# Patient Record
Sex: Female | Born: 1985 | Race: Black or African American | Hispanic: No | Marital: Married | Smoking: Never smoker
Health system: Southern US, Community
[De-identification: ages and names within clinical notes are randomized; demographics above are authoritative.]

## PROBLEM LIST (undated history)

## (undated) DIAGNOSIS — IMO0002 Reserved for concepts with insufficient information to code with codable children: Secondary | ICD-10-CM

## (undated) DIAGNOSIS — M359 Systemic involvement of connective tissue, unspecified: Secondary | ICD-10-CM

## (undated) DIAGNOSIS — R87619 Unspecified abnormal cytological findings in specimens from cervix uteri: Secondary | ICD-10-CM

## (undated) DIAGNOSIS — G43909 Migraine, unspecified, not intractable, without status migrainosus: Secondary | ICD-10-CM

## (undated) DIAGNOSIS — D649 Anemia, unspecified: Secondary | ICD-10-CM

## (undated) DIAGNOSIS — Z862 Personal history of diseases of the blood and blood-forming organs and certain disorders involving the immune mechanism: Secondary | ICD-10-CM

## (undated) HISTORY — PX: SPLENECTOMY, PARTIAL: SHX787

## (undated) HISTORY — DX: Personal history of diseases of the blood and blood-forming organs and certain disorders involving the immune mechanism: Z86.2

## (undated) HISTORY — DX: Anemia, unspecified: D64.9

## (undated) HISTORY — PX: CHOLECYSTECTOMY: SHX55

## (undated) HISTORY — DX: Reserved for concepts with insufficient information to code with codable children: IMO0002

## (undated) HISTORY — DX: Unspecified abnormal cytological findings in specimens from cervix uteri: R87.619

---
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
## 2010-10-15 ENCOUNTER — Other Ambulatory Visit (HOSPITAL_COMMUNITY)
Admission: RE | Admit: 2010-10-15 | Discharge: 2010-10-15 | Disposition: A | Discharge status: Home / Self Care | Payer: Self-pay | Source: Ambulatory Visit | Attending: Family Medicine | Admitting: Family Medicine

## 2010-10-15 ENCOUNTER — Encounter: Payer: Self-pay | Admitting: *Deleted

## 2010-10-15 ENCOUNTER — Ambulatory Visit (INDEPENDENT_AMBULATORY_CARE_PROVIDER_SITE_OTHER): Payer: Self-pay | Admitting: Family Medicine

## 2010-10-15 VITALS — BP 106/68 | HR 71 | Temp 98.6°F | Ht 64.5 in | Wt 161.1 lb

## 2010-10-15 DIAGNOSIS — IMO0002 Reserved for concepts with insufficient information to code with codable children: Secondary | ICD-10-CM

## 2010-10-15 DIAGNOSIS — Z862 Personal history of diseases of the blood and blood-forming organs and certain disorders involving the immune mechanism: Secondary | ICD-10-CM | POA: Insufficient documentation

## 2010-10-15 DIAGNOSIS — N870 Mild cervical dysplasia: Secondary | ICD-10-CM | POA: Insufficient documentation

## 2010-10-15 DIAGNOSIS — N72 Inflammatory disease of cervix uteri: Secondary | ICD-10-CM | POA: Insufficient documentation

## 2010-10-15 DIAGNOSIS — R87612 Low grade squamous intraepithelial lesion on cytologic smear of cervix (LGSIL): Secondary | ICD-10-CM

## 2010-10-15 DIAGNOSIS — R6889 Other general symptoms and signs: Secondary | ICD-10-CM

## 2010-10-15 NOTE — Progress Notes (Signed)
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  Has history of previous abnormal PAP, but doesn't know the results.  She has never had a colposcopy.  Patient given informed consent, signed copy in the chart, time out was performed.  Placed in lithotomy position. Cervix viewed with speculum and colposcope after application of acetic acid.   Colposcopy adequate?  yes Acetowhite lesions?11 o'clock, 7 o'clock. Punctation? no Mosaicism?  12 o'clock Abnormal vasculature?  7 o'clock Biopsies?7 o'clock, 11 o'clock, 12 o'clock ECC?yes  Patient was given post procedure instructions.  She will return in 2 weeks for results.

## 2010-10-15 NOTE — Progress Notes (Signed)
Addended by: Levie Heritage on: 10/15/2010 03:24 PM   Modules accepted: Orders

## 2010-11-11 ENCOUNTER — Ambulatory Visit (INDEPENDENT_AMBULATORY_CARE_PROVIDER_SITE_OTHER): Payer: Self-pay | Admitting: Obstetrics & Gynecology

## 2010-11-11 ENCOUNTER — Encounter: Payer: Self-pay | Admitting: Family Medicine

## 2010-11-11 VITALS — BP 107/69 | HR 69 | Temp 98.2°F | Ht 64.0 in | Wt 163.4 lb

## 2010-11-11 DIAGNOSIS — N870 Mild cervical dysplasia: Secondary | ICD-10-CM | POA: Insufficient documentation

## 2010-11-11 NOTE — Patient Instructions (Signed)
Cervical Dysplasia Cervical dysplasia is a condition in which a woman has abnormal changes in the cells of her cervix. The cervix is the opening to the uterus (womb) between the vagina and the uterus. These changes are called cervical dysplasia and may be the first signs of cervical cancer. These cells can be taken from the cervix during a Pap test and then looked at under a microscope. With early detection, treatment, and close follow-up care, nearly all cervical dysplasia can be cured. If untreated, the mild to moderate stages of dysplasia often grow more severe.  RISK FACTORS  The following increase the risk for cervical dysplasia.  Having had a sexually transmitted disease, including:   Chlamydia.   Human papilloma virus (HPV).   Becoming sexually active before age 32.   Having had more than 1 sexual partner.   Not using protection, such as condoms, during sexual intercourse, especially with new sexual partners.   Having had cancer of the vagina or vulva.   Having a sexual partner whose previous partner had cancer of the cervix or cervical dysplasia.   Having a sexual partner who has or has had cancer of the penis.   Having a weakened immune system (HIV, organ transplant).   Being the daughter of a woman who took DES (diethylstilbestrol) during pregnancy.   A history of cervical cancer in a woman's sister or mother.   Smoking.   Having had an abnormal Pap test in the past.  SYMPTOMS  There are usually no symptoms. If there are symptoms, they may be vague such as:  Abnormal vaginal discharge.   Bleeding between periods or following intercourse.   Bleeding during menopause.   Pain on intercourse (dyspareunia).  DIAGNOSIS   The Pap test is the best way of detecting abnormalities of the cervix.   Biopsy (removing a piece of tissue to look at under the microscope) of the cervix when the Pap test is abnormal or when the Pap test is normal, but the cervix looks abnormal.    TREATMENT  Your caregiver will advise you regarding the need and timing of Pap tests in your follow-up. Women who have been treated for dysplasia should be closely followed with pelvic exams and Pap tests. During the first year following treatment of cervical dysplasia, Pap tests should be done every 6 months, or as recommended by your caregiver. See your caregiver for new or worsening problems. SEEK MEDICAL CARE IF:   You develop genital warts.   You need a prescription for pain medicine following your treatment.  SEEK IMMEDIATE MEDICAL CARE IF:   Your bleeding is heavier than a normal menstrual period.   You develop bright red bleeding, especially if you have blood clots.   You have a fever.   You have increasing cramps or pain not relieved with medicine.   You are lightheaded, unusually weak, or have fainting spells.   You have abnormal vaginal discharge.   You develop abdominal pain.  PREVENTION   The surest way to prevent cervical dysplasia is to abstain from sexual intercourse.   Practice safe sex, use condoms and have only one sex partner who does not have other sex partners.   A Pap test is done to screen for cervical cancer.   The first Pap test should be done at age 20.   Between ages 33 and 76, Pap tests are repeated every 2 years.   Beginning at age 12, you are advised to have a Pap test every 3 years  as long as your past 3 Pap tests have been normal.   Some women have medical problems that increase the chance of getting cervical cancer. Talk to your caregiver about these problems. It is especially important to talk to your caregiver if a new problem develops soon after your last Pap test. In these cases, your caregiver may recommend more frequent screening and Pap tests.   The above recommendations are the same for women who have or have not gotten the vaccine for HPV (Human Papillomavirus).   If you had a hysterectomy for a problem that was not a cancer or a  condition that could lead to cancer, then you no longer need Pap tests. However, even if you no longer need a Pap test, a regular exam is a good idea to make sure no other problems are starting.    If you are between ages 59 and 84, and you have had normal Pap tests going back 10 years, you no longer need Pap tests. However, even if you no longer need a Pap test, a regular exam is a good idea to make sure no other problems are starting.    If you have had past treatment for cervical cancer or a condition that could lead to cancer, you need Pap tests and screening for cancer for at least 20 years after your treatment.   If Pap tests have been discontinued, risk factors (such as a new sexual partner) need to be re-assessed to determine if screening should be resumed.   Some women may need screenings more often if they are at high risk for cervical cancer.   Your caregiver may do additional tests including:   Colposcopy. A procedure in which a special microscope magnifies the cells and allows the provider to closely examine the cervix, vagina, and vulva.   Biopsy. A small tissue sample is taken from the cervix, vagina or vulva. This is generally done in your caregivers office.   A cone biopsy (cold knife or laser). A large tissue sample is taken from the cervix. This procedure is usually done in an operating room under a general anesthetic. The cone often removes all abnormal tissue and so may also complete the treatment.   LEEP, also removing a circular portion of the cervix and is done in a doctors office under a local anesthetic.   Now there is a vaccine, Gardasil, that was developed to prevent the HPV'S that can cause cancer of the cervix and genital warts. It is recommended for females ages 69 to 73. It should not be given to pregnant women until more is known about its effects on the fetus. Not all cancers of the cervix are caused by the HPV. Routine gynecology exams and Pap tests should  continue as recommended by your caregiver.  Document Released: 12/21/2004 Document Revised: 09/02/2010 Document Reviewed: 12/13/2007 [facility] Patient Information 2012 [place], [place].

## 2010-11-11 NOTE — Progress Notes (Signed)
History:  25 y.o. G1P1001 here today for followup colposcopy results after evaluation for a LGSIL pap.   Pathology 10/16/10 1. Endocervix, curettage - BENIGN ENDOCERVICAL GLANDS AND MUCIN. 2. Cervix, biopsy, 7 o'clock - LOW GRADE SQUAMOUS INTRAEPITHELIAL LESION, CIN-I (MILD DYSPLASIA). - BACKGROUND CHRONIC CERVICITIS. 3. Cervix, biopsy, 11 o'clock - TRANSFORMATION ZONE MUCOSA WITH ASSOCIATED CHRONIC INFLAMMATION. 4. Cervix, biopsy, 12 o'clock - TRANSFORMATION ZONE MUCOSA WITH ASSOCIATED EXTENSIVE CHRONIC CERVICITIS.  The following portions of the patient's history were reviewed and updated as appropriate: allergies, current medications, past family history, past medical history, past social history, past surgical history and problem list.   Objective:  Physical Exam Blood pressure 107/69, pulse 69, temperature 98.2 F (36.8 C), temperature source Oral, height 5\' 4"  (1.626 m), weight 163 lb 6.4 oz (74.118 kg), last menstrual period 10/21/2010. Physical exam deferred  Assessment & Plan:  Discussed results with patient. For her cervical dysplasia, patient needs pap smears every six months until she has two consecutive normal pap smears, then she can resume annual screening. If any pap smear is abnormal, she will need repeat colposcopy and appropriate evaluation. Also discussed Gardasil vaccination, patient to contact the [facility] to see if she can qualify to obtain this vaccination.

## 2013-11-05 ENCOUNTER — Encounter: Payer: Self-pay | Admitting: Family Medicine

## 2015-03-25 DIAGNOSIS — Z9081 Acquired absence of spleen: Secondary | ICD-10-CM | POA: Insufficient documentation

## 2015-03-25 DIAGNOSIS — Z8739 Personal history of other diseases of the musculoskeletal system and connective tissue: Secondary | ICD-10-CM | POA: Insufficient documentation

## 2016-07-12 ENCOUNTER — Encounter: Payer: Self-pay | Admitting: Emergency Medicine

## 2016-07-12 ENCOUNTER — Emergency Department
Admission: EM | Admit: 2016-07-12 | Discharge: 2016-07-13 | Disposition: A | Discharge status: Home / Self Care | Payer: Commercial Managed Care - PPO | Attending: Emergency Medicine | Admitting: Emergency Medicine

## 2016-07-12 ENCOUNTER — Emergency Department: Payer: Commercial Managed Care - PPO

## 2016-07-12 DIAGNOSIS — J90 Pleural effusion, not elsewhere classified: Secondary | ICD-10-CM | POA: Diagnosis not present

## 2016-07-12 DIAGNOSIS — R079 Chest pain, unspecified: Secondary | ICD-10-CM

## 2016-07-12 DIAGNOSIS — Z79899 Other long term (current) drug therapy: Secondary | ICD-10-CM | POA: Diagnosis not present

## 2016-07-12 HISTORY — DX: Systemic involvement of connective tissue, unspecified: M35.9

## 2016-07-12 LAB — CBC
HCT: 35.3 % (ref 35.0–47.0)
HEMOGLOBIN: 11.5 g/dL — AB (ref 12.0–16.0)
MCH: 24.8 pg — AB (ref 26.0–34.0)
MCHC: 32.5 g/dL (ref 32.0–36.0)
MCV: 76.3 fL — AB (ref 80.0–100.0)
PLATELETS: 372 10*3/uL (ref 150–440)
RBC: 4.63 MIL/uL (ref 3.80–5.20)
RDW: 17.7 % — ABNORMAL HIGH (ref 11.5–14.5)
WBC: 11.5 10*3/uL — ABNORMAL HIGH (ref 3.6–11.0)

## 2016-07-12 NOTE — ED Notes (Signed)
Pt in xr

## 2016-07-12 NOTE — ED Triage Notes (Signed)
Pt ambulatory to triage with steady gait, no distress noted. Pt reports intermittent central chest pressure accompanied by SOB x2 days. Pt recently gave birth without complications last Tuesday. Pts PCP recommenced pt come to ED tonight for PE rule out.

## 2016-07-13 ENCOUNTER — Encounter: Payer: Self-pay | Admitting: Radiology

## 2016-07-13 ENCOUNTER — Emergency Department: Payer: Commercial Managed Care - PPO

## 2016-07-13 LAB — BASIC METABOLIC PANEL
Anion gap: 8 (ref 5–15)
BUN: 20 mg/dL (ref 6–20)
CALCIUM: 8.8 mg/dL — AB (ref 8.9–10.3)
CO2: 23 mmol/L (ref 22–32)
CREATININE: 1.08 mg/dL — AB (ref 0.44–1.00)
Chloride: 110 mmol/L (ref 101–111)
GFR calc non Af Amer: >60 mL/min (ref >60)
GLUCOSE: 95 mg/dL (ref 65–99)
Potassium: 3.4 mmol/L — ABNORMAL LOW (ref 3.5–5.1)
Sodium: 141 mmol/L (ref 135–145)

## 2016-07-13 LAB — BRAIN NATRIURETIC PEPTIDE: B NATRIURETIC PEPTIDE 5: 349 pg/mL — AB (ref 0.0–100.0)

## 2016-07-13 LAB — TROPONIN I: Troponin I: <0.03 ng/mL (ref <0.03)

## 2016-07-13 MED ORDER — ACETAMINOPHEN 325 MG PO TABS
650.0000 mg | ORAL_TABLET | Freq: Once | ORAL | Status: AC
  Administered 2016-07-13: 650 mg via ORAL
  Filled 2016-07-13: qty 2

## 2016-07-13 MED ORDER — HYDROCORTISONE NA SUCCINATE PF 100 MG IJ SOLR
400.0000 mg | Freq: Once | INTRAMUSCULAR | Status: DC
  Filled 2016-07-13: qty 8

## 2016-07-13 MED ORDER — HYDROCORTISONE NA SUCCINATE PF 100 MG IJ SOLR
200.0000 mg | Freq: Once | INTRAMUSCULAR | Status: AC
  Administered 2016-07-13: 200 mg via INTRAVENOUS

## 2016-07-13 MED ORDER — GI COCKTAIL ~~LOC~~
30.0000 mL | Freq: Once | ORAL | Status: AC
  Administered 2016-07-13: 30 mL via ORAL
  Filled 2016-07-13: qty 30

## 2016-07-13 MED ORDER — DIPHENHYDRAMINE HCL 50 MG/ML IJ SOLN
INTRAMUSCULAR | Status: AC
  Administered 2016-07-13: 50 mg via INTRAVENOUS
  Filled 2016-07-13: qty 1

## 2016-07-13 MED ORDER — MAGNESIUM SULFATE 2 GM/50ML IV SOLN
2.0000 g | Freq: Once | INTRAVENOUS | Status: AC
  Administered 2016-07-13: 2 g via INTRAVENOUS
  Filled 2016-07-13: qty 50

## 2016-07-13 MED ORDER — DIPHENHYDRAMINE HCL 50 MG/ML IJ SOLN: 50.0000 mg | Freq: Once | INTRAMUSCULAR | Status: DC

## 2016-07-13 MED ORDER — AZITHROMYCIN 250 MG PO TABS: ORAL_TABLET | ORAL | 0 refills | Status: AC

## 2016-07-13 MED ORDER — DIPHENHYDRAMINE HCL 50 MG/ML IJ SOLN
50.0000 mg | Freq: Once | INTRAMUSCULAR | Status: AC
  Administered 2016-07-13: 50 mg via INTRAVENOUS

## 2016-07-13 MED ORDER — IOPAMIDOL (ISOVUE-370) INJECTION 76%
75.0000 mL | Freq: Once | INTRAVENOUS | Status: AC | PRN
  Administered 2016-07-13: 75 mL via INTRAVENOUS

## 2016-07-13 MED ORDER — BUTALBITAL-APAP-CAFFEINE 50-325-40 MG PO TABS
2.0000 | ORAL_TABLET | Freq: Once | ORAL | Status: AC
  Administered 2016-07-13: 2 via ORAL
  Filled 2016-07-13: qty 2

## 2016-07-13 NOTE — ED Notes (Signed)
CT called regarding pt past reaction to contrast. Awaiting further instruction from MD Zenda AlpersWebster

## 2016-07-13 NOTE — ED Notes (Signed)
Report to stephanie, rn

## 2016-07-13 NOTE — Discharge Instructions (Signed)
Please follow back up with your primary care physician. Please return if any worsening conditions or any other concerns.

## 2016-07-13 NOTE — ED Notes (Signed)
Pt sits up post benadryl administration, states throbbing HA, sudden onset. Pt tearful. VSS. MD made aware, orders rapidly honored.

## 2016-07-13 NOTE — ED Provider Notes (Signed)
[facility] Emergency Department Provider Note   ____________________________________________   First MD Initiated Contact with Patient 07/12/16 2356     (approximate)  I have reviewed the triage vital signs and the nursing notes.   HISTORY  Chief Complaint Chest Pain    HPI Regina Martin is a 31 y.o. female who comes into the hospital today with some chest pressure and sharp pain whenever she takes a deep breath. The patient denies any shortness of breath. She gave birth on July 3. She reports that her pain is a 5 out of 10 in intensity. She has been taking Motrin but it has not been helping. The patient's delivery was natural with no complications.The patient denies any nausea, vomiting, dizziness or lightheadedness. She is breast-feeding. She has no radiation of the pain but reports it is right in the middle of her chest underneath her 2 breasts. The patient saw her doctor and was told to come and get checked out for a pulmonary embolism. The patient has had these symptoms for the past 2 days.   Past Medical History:  Diagnosis Date  . Abnormal Pap smear    lsil  . Collagen vascular disease (HCC)   . History of ITP     Patient Active Problem List   Diagnosis Date Noted  . CIN I (cervical intraepithelial neoplasia I) 11/11/2010  . Abnormal Pap smear   . History of ITP     Past Surgical History:  Procedure Laterality Date  . spleenectomy  1992    Prior to Admission medications   Medication Sig Start Date End Date Taking? Authorizing Provider  ibuprofen (ADVIL,MOTRIN) 800 MG tablet Take 800 mg by mouth every 8 (eight) hours as needed for pain. 07/08/16  Yes [provider]  ranitidine (ZANTAC) 150 MG tablet Take 150 mg by mouth 2 (two) times daily. 03/24/16 03/24/17 Yes [provider]  azithromycin (ZITHROMAX Z-PAK) 250 MG tablet Take 2 tablets (500 mg) on  Day 1,  followed by 1 tablet (250 mg) once daily on Days 2  through 5. 07/13/16 07/18/16  Rebecka ApleyWebster, Pamalee Marcoe P, MD    Allergies Patient has no known allergies.  Family History  Problem Relation Age of Onset  . Seizures Mother     Social History Social History  Substance Use Topics  . Smoking status: Never Smoker  . Smokeless tobacco: Never Used  . Alcohol use 0.0 oz/week    Review of Systems  Constitutional: No fever/chills Eyes: No visual changes. ENT: No sore throat. Cardiovascular:  chest pain. Respiratory: Denies shortness of breath. Gastrointestinal: No abdominal pain.  No nausea, no vomiting.  No diarrhea.  No constipation. Genitourinary: Negative for dysuria. Musculoskeletal: Negative for back pain. Skin: Negative for rash. Neurological: Negative for headaches, focal weakness or numbness.   ____________________________________________   PHYSICAL EXAM:  VITAL SIGNS: ED Triage Vitals [07/12/16 2326]  Enc Vitals Group     BP 133/78     Pulse Rate (!) 39     Resp 14     Temp 98.6 F (37 C)     Temp src      SpO2 99 %     Weight      Height      Head Circumference      Peak Flow      Pain Score      Pain Loc      Pain Edu?      Excl. in GC?     Constitutional:  Alert and oriented. Well appearing and in Mild distress. Eyes: Conjunctivae are normal. PERRL. EOMI. Head: Atraumatic. Nose: No congestion/rhinnorhea. Mouth/Throat: Mucous membranes are moist.  Oropharynx non-erythematous. Cardiovascular: Normal rate, regular rhythm. Grossly normal heart sounds.  Good peripheral circulation. Respiratory: Normal respiratory effort.  No retractions. Lungs CTAB. Gastrointestinal: Soft and nontender. No distention. Positive bowel sounds Musculoskeletal: No lower extremity tenderness nor edema.   Neurologic:  Normal speech and language. . Skin:  Skin is warm, dry and intact.  Psychiatric: Mood and affect are normal.   ____________________________________________   LABS (all labs ordered are listed, but only abnormal  results are displayed)  Labs Reviewed  BASIC METABOLIC PANEL - Abnormal; Notable for the following:       Result Value   Potassium 3.4 (*)    Creatinine, Ser 1.08 (*)    Calcium 8.8 (*)    All other components within normal limits  CBC - Abnormal; Notable for the following:    WBC 11.5 (*)    Hemoglobin 11.5 (*)    MCV 76.3 (*)    MCH 24.8 (*)    RDW 17.7 (*)    All other components within normal limits  BRAIN NATRIURETIC PEPTIDE - Abnormal; Notable for the following:    B Natriuretic Peptide 349.0 (*)    All other components within normal limits  TROPONIN I   ____________________________________________  EKG  ED ECG REPORT I, Rebecka Apley, the attending physician, personally viewed and interpreted this ECG.   Date: 07/12/2016  EKG Time: 2321  Rate: 43  Rhythm: sinus bradycardia  Axis: normal  Intervals:none  ST&T Change: none  ____________________________________________  RADIOLOGY  Dg Chest 2 View  Result Date: 07/12/2016 CLINICAL DATA:  Initial evaluation for intermittent central chest pressure airway shortness of breath. EXAM: CHEST  2 VIEW COMPARISON:  None. FINDINGS: Cardiac and mediastinal silhouettes are within normal limits. Lungs normally inflated. Small bilateral pleural effusions, slightly larger on the right. No pulmonary edema. No focal infiltrates. No pneumothorax. No acute osseus abnormality. IMPRESSION: 1. Small bilateral pleural effusions, slightly larger on the right. 2. No other active cardiopulmonary disease. Electronically Signed   By: Rise Mu M.D.   On: 07/12/2016 23:57   Ct Angio Chest Pe W And/or Wo Contrast  Result Date: 07/13/2016 CLINICAL DATA:  31 year old female recent postpartum presenting with pleuritic chest pain. EXAM: CT ANGIOGRAPHY CHEST WITH CONTRAST TECHNIQUE: Multidetector CT imaging of the chest was performed using the standard protocol during bolus administration of intravenous contrast. Multiplanar CT image  reconstructions and MIPs were obtained to evaluate the vascular anatomy. CONTRAST:  75 cc Isovue 370 COMPARISON:  Chest radiograph dated 07/12/2016 FINDINGS: Cardiovascular: Cardiac size. No pericardial effusion. The thoracic aorta is unremarkable. The origins of the great vessels of the aortic arch appear patent. There is mild dilatation of the main pulmonary trunk concerning for underlying pulmonary hypertension. No CT evidence of pulmonary embolism. Mediastinum/Nodes: No hilar or mediastinal adenopathy. The esophagus and thyroid gland are grossly unremarkable. Lungs/Pleura: Small bilateral pleural effusions, right greater left. There is associated compressive atelectasis of the right lung base. Superimposed pneumonia is not excluded. Clinical correlation is recommended. The left lung is clear. No pneumothorax. The central airways are patent. Upper Abdomen: There is apparent fullness of the left renal collecting system. High attenuating content within the gallbladder may represent sludge versus small stones versus medicated state excreted intravenous contrast. Musculoskeletal: No chest wall abnormality. No acute or significant osseous findings. Review of the MIP images confirms  the above findings. IMPRESSION: 1. No CT evidence of pulmonary embolism. 2. Mild prominence of the main pulmonary trunk may represent a degree of pulmonary hypertension. 3. Small bilateral pleural effusions with subsegmental right lung base atelectasis. Pneumonia is not excluded. Clinical correlation is recommended. 4. Top-normal cardiac size. 5. Mild left hydronephrosis. Electronically Signed   By: Elgie Collard M.D.   On: 07/13/2016 06:21    ____________________________________________   PROCEDURES  Procedure(s) performed: None  Procedures  Critical Care performed: No  ____________________________________________   INITIAL IMPRESSION / ASSESSMENT AND PLAN / ED COURSE  Pertinent labs & imaging results that were  available during my care of the patient were reviewed by me and considered in my medical decision making (see chart for details).  This is a 31 year old who recently delivered a child and comes in today with some chest pain. The patient's blood work at this time is unremarkable but given her recent pregnancy and delivery I will send the patient for a CT angiogram of her chest looking for possible pulmonary embolism. I will also give the patient a GI cocktail. I will reassess the patient once I received her results.     It was discovered prior to her CT that the patient had hives the last time she had IV contrast. We decided to do the emergency protocol with hydrocortisone and Benadryl prior to performing her CT. The patient did have a CT scan which showed no pulmonary embolism. The patient had some small bilateral pleural effusions with some atelectasis and pneumonia was not excluded. The patient developed a headache after getting Benadryl. I initially gave her some Tylenol then give her Fioricet and some magnesium sulfate. I went back in to speak with the patient and her mother regarding her headache. She reports that it is fine and she just wants to go home. She reports that she does not 1 any other studies and does not want any further evaluation. The patient will be discharged to follow-up. ____________________________________________   FINAL CLINICAL IMPRESSION(S) / ED DIAGNOSES  Final diagnoses:  Chest pain, unspecified type  Pleural effusion      NEW MEDICATIONS STARTED DURING THIS VISIT:  New Prescriptions   AZITHROMYCIN (ZITHROMAX Z-PAK) 250 MG TABLET    Take 2 tablets (500 mg) on  Day 1,  followed by 1 tablet (250 mg) once daily on Days 2 through 5.     Note:  This document was prepared using Dragon voice recognition software and may include unintentional dictation errors.    Rebecka Apley, MD 07/13/16 [phone]

## 2016-07-13 NOTE — ED Notes (Signed)
Pt returned to tx room, reports pain 9/10 throbbing HA remains despite tylenol.

## 2016-11-02 DIAGNOSIS — M654 Radial styloid tenosynovitis [de Quervain]: Secondary | ICD-10-CM | POA: Insufficient documentation

## 2018-02-27 DIAGNOSIS — Z8711 Personal history of peptic ulcer disease: Secondary | ICD-10-CM | POA: Insufficient documentation

## 2019-04-24 DIAGNOSIS — L219 Seborrheic dermatitis, unspecified: Secondary | ICD-10-CM | POA: Insufficient documentation

## 2019-04-24 DIAGNOSIS — L700 Acne vulgaris: Secondary | ICD-10-CM | POA: Insufficient documentation

## 2019-04-30 DIAGNOSIS — E538 Deficiency of other specified B group vitamins: Secondary | ICD-10-CM | POA: Insufficient documentation

## 2019-04-30 DIAGNOSIS — F411 Generalized anxiety disorder: Secondary | ICD-10-CM | POA: Insufficient documentation

## 2019-08-29 ENCOUNTER — Other Ambulatory Visit: Payer: Self-pay

## 2019-08-29 DIAGNOSIS — Z20822 Contact with and (suspected) exposure to covid-19: Secondary | ICD-10-CM

## 2019-08-30 LAB — NOVEL CORONAVIRUS, NAA: SARS-CoV-2, NAA: NOT DETECTED

## 2019-08-30 LAB — SARS-COV-2, NAA 2 DAY TAT

## 2019-09-03 ENCOUNTER — Telehealth: Payer: Self-pay

## 2019-09-03 NOTE — Telephone Encounter (Signed)
Patient called stating she was told to give our office a call regarding her reaction she had to [employer] vaccine on 08/12/2019. Patient states she only had hives after receiving her injection. Patient does currently work for [employer].  Please Advise.

## 2019-09-03 NOTE — Telephone Encounter (Signed)
Called and spoke to patient and she states she reacted to the 1st vaccine and it was [employer]. Hives appeared an hour after injection and it appeared on the arm where she got her injection and legs, no shortness of breath. She did take Benadryl that night which did help and was only able to take zyrtec after she got off work that Monday. She denied any reactions to vaccines and miralax in the past but has gotten lip fillers in February or March 2021.

## 2019-09-04 ENCOUNTER — Other Ambulatory Visit: Payer: 59

## 2019-09-04 NOTE — Telephone Encounter (Signed)
Given that the hives occurred over more of her body than just where she got the shot, I think component testing is probably warranted.  We are doing COVID component clinic on September 17 in [place] if they are interested in going there.  This will include her getting the Pfizer vaccine in a controlled environment as well after the testing is finished.  Malachi Bonds, MD [facility]

## 2019-09-05 ENCOUNTER — Other Ambulatory Visit: Payer: Self-pay

## 2019-09-05 ENCOUNTER — Other Ambulatory Visit: Payer: 59

## 2019-09-05 DIAGNOSIS — Z20822 Contact with and (suspected) exposure to covid-19: Secondary | ICD-10-CM

## 2019-09-05 NOTE — Telephone Encounter (Signed)
Left message for patient to give [place] a call back regarding Covid Component testing.

## 2019-09-06 ENCOUNTER — Other Ambulatory Visit: Payer: Self-pay

## 2019-09-06 ENCOUNTER — Ambulatory Visit
Admission: EM | Admit: 2019-09-06 | Discharge: 2019-09-06 | Disposition: A | Discharge status: Home / Self Care | Payer: 59 | Attending: Emergency Medicine | Admitting: Emergency Medicine

## 2019-09-06 DIAGNOSIS — N644 Mastodynia: Secondary | ICD-10-CM

## 2019-09-06 HISTORY — DX: Migraine, unspecified, not intractable, without status migrainosus: G43.909

## 2019-09-06 LAB — NOVEL CORONAVIRUS, NAA: SARS-CoV-2, NAA: NOT DETECTED

## 2019-09-06 MED ORDER — IBUPROFEN 800 MG PO TABS: 800.0000 mg | ORAL_TABLET | Freq: Three times a day (TID) | ORAL | 0 refills | Status: DC

## 2019-09-06 NOTE — ED Triage Notes (Signed)
Pt c/o pain to lt breast x 1.5wks, states it started after receiving 1st covid vaccine on 08/08. Denies chest pain.

## 2019-09-06 NOTE — ED Provider Notes (Signed)
[facility]    CSN: [id number] Arrival date & time: 09/06/19  1857      History   Chief Complaint Chief Complaint  Patient presents with  . Breast Pain    HPI Regina Martin is a 34 y.o. female  Present for 1.5-week course of left breast pain.  Denies change in appearance of breast, mass, nipple retraction/inversion or discharge.  States this began after receiving first dose of Covid vaccine on 8/8.  No chest pain, palpitations, shortness of breath or fever.  Past Medical History:  Diagnosis Date  . Migraine     There are no problems to display for this patient.   Past Surgical History:  Procedure Laterality Date  . CHOLECYSTECTOMY    . SPLENECTOMY, PARTIAL      OB History   No obstetric history on file.      Home Medications    Prior to Admission medications   Medication Sig Start Date End Date Taking? Authorizing Provider  ibuprofen (ADVIL) 800 MG tablet Take 1 tablet (800 mg total) by mouth 3 (three) times daily. 09/06/19   Hall-Potvin, [place], PA-C    Family History History reviewed. No pertinent family history.  Social History Social History   Tobacco Use  . Smoking status: Never Smoker  . Smokeless tobacco: Never Used  Substance Use Topics  . Alcohol use: Yes    Comment: occ  . Drug use: Never     Allergies   Contrast media [iodinated diagnostic agents]   Review of Systems As per HPI   Physical Exam Triage Vital Signs ED Triage Vitals  Enc Vitals Group     BP 09/06/19 1952 118/77     Pulse Rate 09/06/19 1952 70     Resp 09/06/19 1952 18     Temp 09/06/19 1952 98.2 F (36.8 C)     Temp Source 09/06/19 1952 Oral     SpO2 09/06/19 1952 96 %     Weight --      Height --      Head Circumference --      Peak Flow --      Pain Score 09/06/19 1953 3     Pain Loc --      Pain Edu? --      Excl. in GC? --    No data found.  Updated Vital Signs BP 118/77 (BP Location: Left Arm)   Pulse 70   Temp 98.2 F (36.8  C) (Oral)   Resp 18   LMP 08/22/2019   SpO2 96%   Visual Acuity Right Eye Distance:   Left Eye Distance:   Bilateral Distance:    Right Eye Near:   Left Eye Near:    Bilateral Near:     Physical Exam Constitutional:      General: She is not in acute distress. HENT:     Head: Normocephalic and atraumatic.  Eyes:     General: No scleral icterus.    Pupils: Pupils are equal, round, and reactive to light.  Cardiovascular:     Rate and Rhythm: Normal rate.  Pulmonary:     Effort: Pulmonary effort is normal.  Chest:       Comments: No axillary or supraclavicular LAD. Skin:    Coloration: Skin is not jaundiced or pale.  Neurological:     Mental Status: She is alert and oriented to person, place, and time.      UC Treatments / Results  Labs (all labs ordered are listed,  but only abnormal results are displayed) Labs Reviewed - No data to display  EKG   Radiology No results found.  Procedures Procedures (including critical care time)  Medications Ordered in UC Medications - No data to display  Initial Impression / Assessment and Plan / UC Course  I have reviewed the triage vital signs and the nursing notes.  Pertinent labs & imaging results that were available during my care of the patient were reviewed by me and considered in my medical decision making (see chart for details).     H&P concerning for acute lymphadenitis of left breast.  No focal masses, nipple changes or discharge.  Will treat supportively as outlined below, follow-up with OB/GYN in 1-2 weeks.  Return precautions discussed, pt verbalized understanding and is agreeable to plan. Final Clinical Impressions(s) / UC Diagnoses   Final diagnoses:  Breast pain, left     Discharge Instructions     Heat therapy (hot compress, warm wash rag, hot showers, etc.) can help relax muscles and soothe muscle aches. Cold therapy (ice packs) can be used to help swelling both after injury and after prolonged  use of areas of chronic pain/aches.  Pain medication:  350 mg-1000 mg of Tylenol (acetaminophen) and/or 200 mg - 800 mg of Advil (ibuprofen, Motrin) every 8 hours as needed.  May alternate between the two throughout the day as they are generally safe to take together.  DO NOT exceed more than 3000 mg of Tylenol or 3200 mg of ibuprofen in a 24 hour period as this could damage your stomach, kidneys, liver, or increase your bleeding risk.  Important to follow up with specialist(s) below for further evaluation/management if your symptoms persist or worsen.    ED Prescriptions    Medication Sig Dispense Auth. Provider   ibuprofen (ADVIL) 800 MG tablet Take 1 tablet (800 mg total) by mouth 3 (three) times daily. 21 tablet Hall-Potvin, [place], PA-C     I have reviewed the PDMP during this encounter.   Hall-Potvin, [place], [place] 09/06/19 2050

## 2019-09-06 NOTE — Discharge Instructions (Addendum)
Heat therapy (hot compress, warm wash rag, hot showers, etc.) can help relax muscles and soothe muscle aches. Cold therapy (ice packs) can be used to help swelling both after injury and after prolonged use of areas of chronic pain/aches.  Pain medication:  350 mg-1000 mg of Tylenol (acetaminophen) and/or 200 mg - 800 mg of Advil (ibuprofen, Motrin) every 8 hours as needed.  May alternate between the two throughout the day as they are generally safe to take together.  DO NOT exceed more than 3000 mg of Tylenol or 3200 mg of ibuprofen in a 24 hour period as this could damage your stomach, kidneys, liver, or increase your bleeding risk.  Important to follow up with specialist(s) below for further evaluation/management if your symptoms persist or worsen. 

## 2019-09-11 NOTE — Telephone Encounter (Signed)
Called and left a voicemail asking for patient to return call to discuss.  °

## 2019-09-13 NOTE — Telephone Encounter (Signed)
I called and spoke with her. For future reference: The codes are 95018 and 95076. Anyone would need to call their insurance to see if they have met their deductible etc.   Thank you.

## 2019-09-13 NOTE — Telephone Encounter (Signed)
Patient has been scheduled for COVID component testing on the 21st in [facility]. She was wondering how much this would cost with her insurance. Candice would you mind looking into this for me? She said that it is ok to send her a response through her MyChart.

## 2019-09-25 ENCOUNTER — Encounter: Payer: 59 | Admitting: Family Medicine

## 2019-10-11 ENCOUNTER — Other Ambulatory Visit: Payer: 59

## 2019-10-11 DIAGNOSIS — Z20822 Contact with and (suspected) exposure to covid-19: Secondary | ICD-10-CM

## 2019-10-12 LAB — NOVEL CORONAVIRUS, NAA: SARS-CoV-2, NAA: NOT DETECTED

## 2019-10-12 LAB — SARS-COV-2, NAA 2 DAY TAT

## 2019-11-14 ENCOUNTER — Ambulatory Visit
Admission: RE | Admit: 2019-11-14 | Discharge: 2019-11-14 | Disposition: A | Discharge status: Home / Self Care | Payer: 59 | Source: Ambulatory Visit | Attending: Emergency Medicine | Admitting: Emergency Medicine

## 2019-11-14 ENCOUNTER — Other Ambulatory Visit: Payer: Self-pay

## 2019-11-14 VITALS — BP 102/71 | HR 102 | Temp 99.0°F | Resp 19

## 2019-11-14 DIAGNOSIS — R5383 Other fatigue: Secondary | ICD-10-CM | POA: Diagnosis not present

## 2019-11-14 NOTE — Discharge Instructions (Addendum)
Your COVID test is pending.  You should self quarantine until the test result is back.    Take Tylenol as needed for fever or discomfort.  Rest and keep yourself hydrated.    Follow-up with your primary care provider if your symptoms are not improving.     

## 2019-11-14 NOTE — ED Provider Notes (Signed)
Regina Martin    CSN: [id number] Arrival date & time: 11/14/19  1356      History   Chief Complaint Chief Complaint  Patient presents with  . Chills  . Generalized Body Aches    HPI Regina Martin is a 34 y.o. female.   Patient presents with 2-day history of chills, body aches, fatigue, decreased appetite.  She denies fever, sore throat, cough, shortness of breath, vomiting, diarrhea, or other symptoms.  OTC treatment attempted at home.  Her medical history includes migraines.  The history is provided by the patient.    Past Medical History:  Diagnosis Date  . Migraine     There are no problems to display for this patient.   Past Surgical History:  Procedure Laterality Date  . CHOLECYSTECTOMY    . SPLENECTOMY, PARTIAL      OB History   No obstetric history on file.      Home Medications    Prior to Admission medications   Medication Sig Start Date End Date Taking? Authorizing Provider  ibuprofen (ADVIL) 800 MG tablet Take 1 tablet (800 mg total) by mouth 3 (three) times daily. 09/06/19   Hall-Potvin, [place], PA-C    Family History Family History  Problem Relation Age of Onset  . Healthy Mother   . Healthy Father     Social History Social History   Tobacco Use  . Smoking status: Never Smoker  . Smokeless tobacco: Never Used  Substance Use Topics  . Alcohol use: Yes    Comment: occ  . Drug use: Never     Allergies   Contrast media [iodinated diagnostic agents]   Review of Systems Review of Systems  Constitutional: Positive for appetite change, chills and fatigue. Negative for fever.  HENT: Negative for ear pain and sore throat.   Eyes: Negative for pain and visual disturbance.  Respiratory: Negative for cough and shortness of breath.   Cardiovascular: Negative for chest pain and palpitations.  Gastrointestinal: Negative for abdominal pain, diarrhea and vomiting.  Genitourinary: Negative for dysuria and hematuria.   Musculoskeletal: Negative for arthralgias and back pain.  Skin: Negative for color change and rash.  Neurological: Negative for seizures and syncope.  All other systems reviewed and are negative.    Physical Exam Triage Vital Signs ED Triage Vitals  Enc Vitals Group     BP 11/14/19 1411 102/71     Pulse Rate 11/14/19 1411 (!) 102     Resp 11/14/19 1411 19     Temp 11/14/19 1411 99 F (37.2 C)     Temp src --      SpO2 11/14/19 1411 96 %     Weight --      Height --      Head Circumference --      Peak Flow --      Pain Score 11/14/19 1409 0     Pain Loc --      Pain Edu? --      Excl. in GC? --    No data found.  Updated Vital Signs BP 102/71   Pulse (!) 102   Temp 99 F (37.2 C)   Resp 19   LMP 10/05/2019   SpO2 96%   Visual Acuity Right Eye Distance:   Left Eye Distance:   Bilateral Distance:    Right Eye Near:   Left Eye Near:    Bilateral Near:     Physical Exam Vitals and nursing note reviewed.  Constitutional:  General: She is not in acute distress.    Appearance: She is well-developed.  HENT:     Head: Normocephalic and atraumatic.     Right Ear: Tympanic membrane normal.     Left Ear: Tympanic membrane normal.     Nose: Nose normal.     Mouth/Throat:     Mouth: Mucous membranes are moist.     Pharynx: Oropharynx is clear.  Eyes:     Conjunctiva/sclera: Conjunctivae normal.  Cardiovascular:     Rate and Rhythm: Normal rate and regular rhythm.     Heart sounds: Normal heart sounds.  Pulmonary:     Effort: Pulmonary effort is normal. No respiratory distress.     Breath sounds: Normal breath sounds. No wheezing or rhonchi.  Abdominal:     Palpations: Abdomen is soft.     Tenderness: There is no abdominal tenderness. There is no guarding or rebound.  Musculoskeletal:     Cervical back: Neck supple.  Skin:    General: Skin is warm and dry.  Neurological:     General: No focal deficit present.     Mental Status: She is alert and  oriented to person, place, and time.     Gait: Gait normal.  Psychiatric:        Mood and Affect: Mood normal.        Behavior: Behavior normal.      UC Treatments / Results  Labs (all labs ordered are listed, but only abnormal results are displayed) Labs Reviewed  NOVEL CORONAVIRUS, NAA    EKG   Radiology No results found.  Procedures Procedures (including critical care time)  Medications Ordered in UC Medications - No data to display  Initial Impression / Assessment and Plan / UC Course  I have reviewed the triage vital signs and the nursing notes.  Pertinent labs & imaging results that were available during my care of the patient were reviewed by me and considered in my medical decision making (see chart for details).   Fatigue.  PCR COVID pending.  Instructed patient to self quarantine until the test result is back.  Discussed symptomatic treatment including Tylenol, rest, hydration.  Instructed patient to follow up with PCP if her symptoms are not improving  Patient agrees to plan of care.    Final Clinical Impressions(s) / UC Diagnoses   Final diagnoses:  Fatigue, unspecified type     Discharge Instructions     Your COVID test is pending.  You should self quarantine until the test result is back.    Take Tylenol as needed for fever or discomfort.  Rest and keep yourself hydrated.    Follow up with your primary care provider if your symptoms are not improving.           ED Prescriptions    None     PDMP not reviewed this encounter.   Mickie Bail, NP 11/14/19 1432

## 2019-11-14 NOTE — ED Triage Notes (Signed)
Pt presents with complaints of decreased appetite, cold chills, and body aches x 2 days. Pt denies relief with otc medications. Patient noticed today small red bumps pop up on the palm of her hand.

## 2019-11-15 ENCOUNTER — Other Ambulatory Visit: Payer: Self-pay | Admitting: Family

## 2019-11-15 DIAGNOSIS — R5381 Other malaise: Secondary | ICD-10-CM

## 2019-11-15 DIAGNOSIS — R5383 Other fatigue: Secondary | ICD-10-CM

## 2019-11-16 LAB — CBC WITH DIFFERENTIAL/PLATELET
Absolute Monocytes: 976 cells/uL — ABNORMAL HIGH (ref 200–950)
Basophils Absolute: 48 cells/uL (ref 0–200)
Basophils Relative: 0.3 %
Eosinophils Absolute: 880 cells/uL — ABNORMAL HIGH (ref 15–500)
Eosinophils Relative: 5.5 %
HCT: 38.9 % (ref 35.0–45.0)
Hemoglobin: 12 g/dL (ref 11.7–15.5)
Lymphs Abs: 1792 cells/uL (ref 850–3900)
MCH: 21.4 pg — ABNORMAL LOW (ref 27.0–33.0)
MCHC: 30.8 g/dL — ABNORMAL LOW (ref 32.0–36.0)
MCV: 69.5 fL — ABNORMAL LOW (ref 80.0–100.0)
MPV: 11.1 fL (ref 7.5–12.5)
Monocytes Relative: 6.1 %
Neutro Abs: 12304 cells/uL — ABNORMAL HIGH (ref 1500–7800)
Neutrophils Relative %: 76.9 %
Platelets: 403 10*3/uL — ABNORMAL HIGH (ref 140–400)
RBC: 5.6 10*6/uL — ABNORMAL HIGH (ref 3.80–5.10)
RDW: 20.4 % — ABNORMAL HIGH (ref 11.0–15.0)
Total Lymphocyte: 11.2 %
WBC: 16 10*3/uL — ABNORMAL HIGH (ref 3.8–10.8)

## 2019-11-16 LAB — CBC MORPHOLOGY

## 2019-11-16 LAB — URINALYSIS, ROUTINE W REFLEX MICROSCOPIC
Glucose, UA: NEGATIVE
Hgb urine dipstick: NEGATIVE
Ketones, ur: NEGATIVE
Leukocytes,Ua: NEGATIVE
Nitrite: NEGATIVE
Protein, ur: NEGATIVE
Specific Gravity, Urine: 1.006 (ref 1.001–1.03)
pH: 6 (ref 5.0–8.0)

## 2019-11-16 LAB — COMPLETE METABOLIC PANEL WITH GFR
AG Ratio: 1.4 (calc) (ref 1.0–2.5)
ALT: 119 U/L — ABNORMAL HIGH (ref 6–29)
AST: 109 U/L — ABNORMAL HIGH (ref 10–30)
Albumin: 4.2 g/dL (ref 3.6–5.1)
Alkaline phosphatase (APISO): 141 U/L — ABNORMAL HIGH (ref 31–125)
BUN/Creatinine Ratio: 8 (calc) (ref 6–22)
BUN: 6 mg/dL — ABNORMAL LOW (ref 7–25)
CO2: 29 mmol/L (ref 20–32)
Calcium: 9.4 mg/dL (ref 8.6–10.2)
Chloride: 95 mmol/L — ABNORMAL LOW (ref 98–110)
Creat: 0.76 mg/dL (ref 0.50–1.10)
GFR, Est African American: 119 mL/min/{1.73_m2} (ref >=60)
GFR, Est Non African American: 103 mL/min/{1.73_m2} (ref >=60)
Globulin: 3.1 g/dL (calc) (ref 1.9–3.7)
Glucose, Bld: 114 mg/dL — ABNORMAL HIGH (ref 65–99)
Potassium: 3.3 mmol/L — ABNORMAL LOW (ref 3.5–5.3)
Sodium: 134 mmol/L — ABNORMAL LOW (ref 135–146)
Total Bilirubin: 4 mg/dL — ABNORMAL HIGH (ref 0.2–1.2)
Total Protein: 7.3 g/dL (ref 6.1–8.1)

## 2019-11-17 ENCOUNTER — Other Ambulatory Visit: Payer: Self-pay

## 2019-11-17 ENCOUNTER — Encounter (HOSPITAL_BASED_OUTPATIENT_CLINIC_OR_DEPARTMENT_OTHER): Payer: Self-pay

## 2019-11-17 ENCOUNTER — Emergency Department (HOSPITAL_BASED_OUTPATIENT_CLINIC_OR_DEPARTMENT_OTHER)
Admission: EM | Admit: 2019-11-17 | Discharge: 2019-11-17 | Disposition: A | Discharge status: Home / Self Care | Payer: 59 | Attending: Emergency Medicine | Admitting: Emergency Medicine

## 2019-11-17 ENCOUNTER — Emergency Department (HOSPITAL_BASED_OUTPATIENT_CLINIC_OR_DEPARTMENT_OTHER): Payer: 59

## 2019-11-17 DIAGNOSIS — R7989 Other specified abnormal findings of blood chemistry: Secondary | ICD-10-CM

## 2019-11-17 DIAGNOSIS — R519 Headache, unspecified: Secondary | ICD-10-CM | POA: Diagnosis not present

## 2019-11-17 DIAGNOSIS — R7401 Elevation of levels of liver transaminase levels: Secondary | ICD-10-CM | POA: Diagnosis not present

## 2019-11-17 DIAGNOSIS — M791 Myalgia, unspecified site: Secondary | ICD-10-CM | POA: Insufficient documentation

## 2019-11-17 DIAGNOSIS — R5383 Other fatigue: Secondary | ICD-10-CM

## 2019-11-17 DIAGNOSIS — E876 Hypokalemia: Secondary | ICD-10-CM | POA: Insufficient documentation

## 2019-11-17 DIAGNOSIS — Z20822 Contact with and (suspected) exposure to covid-19: Secondary | ICD-10-CM | POA: Insufficient documentation

## 2019-11-17 DIAGNOSIS — D72829 Elevated white blood cell count, unspecified: Secondary | ICD-10-CM | POA: Insufficient documentation

## 2019-11-17 LAB — COMPREHENSIVE METABOLIC PANEL
ALT: 150 U/L — ABNORMAL HIGH (ref 0–44)
AST: 93 U/L — ABNORMAL HIGH (ref 15–41)
Albumin: 3.6 g/dL (ref 3.5–5.0)
Alkaline Phosphatase: 183 U/L — ABNORMAL HIGH (ref 38–126)
Anion gap: 14 (ref 5–15)
BUN: 9 mg/dL (ref 6–20)
CO2: 24 mmol/L (ref 22–32)
Calcium: 9 mg/dL (ref 8.9–10.3)
Chloride: 97 mmol/L — ABNORMAL LOW (ref 98–111)
Creatinine, Ser: 0.57 mg/dL (ref 0.44–1.00)
GFR, Estimated: >60 mL/min (ref >60)
Glucose, Bld: 86 mg/dL (ref 70–99)
Potassium: 2.9 mmol/L — ABNORMAL LOW (ref 3.5–5.1)
Sodium: 135 mmol/L (ref 135–145)
Total Bilirubin: 1.4 mg/dL — ABNORMAL HIGH (ref 0.3–1.2)
Total Protein: 8.1 g/dL (ref 6.5–8.1)

## 2019-11-17 LAB — CBC WITH DIFFERENTIAL/PLATELET
Abs Immature Granulocytes: 0 10*3/uL (ref 0.00–0.07)
Basophils Absolute: 0 10*3/uL (ref 0.0–0.1)
Basophils Relative: 0 %
Eosinophils Absolute: 1 10*3/uL — ABNORMAL HIGH (ref 0.0–0.5)
Eosinophils Relative: 6 %
HCT: 34.1 % — ABNORMAL LOW (ref 36.0–46.0)
Hemoglobin: 11.7 g/dL — ABNORMAL LOW (ref 12.0–15.0)
Lymphocytes Relative: 18 %
Lymphs Abs: 2.9 10*3/uL (ref 0.7–4.0)
MCH: 21.4 pg — ABNORMAL LOW (ref 26.0–34.0)
MCHC: 34.3 g/dL (ref 30.0–36.0)
MCV: 62.5 fL — ABNORMAL LOW (ref 80.0–100.0)
Monocytes Absolute: 1.3 10*3/uL — ABNORMAL HIGH (ref 0.1–1.0)
Monocytes Relative: 8 %
Neutro Abs: 10.8 10*3/uL — ABNORMAL HIGH (ref 1.7–7.7)
Neutrophils Relative %: 68 %
Platelets: ADEQUATE 10*3/uL (ref 150–400)
RBC: 5.46 MIL/uL — ABNORMAL HIGH (ref 3.87–5.11)
RDW: 17.5 % — ABNORMAL HIGH (ref 11.5–15.5)
WBC: 15.9 10*3/uL — ABNORMAL HIGH (ref 4.0–10.5)
nRBC: 0.3 % — ABNORMAL HIGH (ref 0.0–0.2)

## 2019-11-17 LAB — TSH: TSH: 0.971 u[IU]/mL (ref 0.350–4.500)

## 2019-11-17 LAB — PLATELET COUNT: Platelets: 344 10*3/uL (ref 150–400)

## 2019-11-17 LAB — NOVEL CORONAVIRUS, NAA: SARS-CoV-2, NAA: NOT DETECTED

## 2019-11-17 LAB — RESPIRATORY PANEL BY RT PCR (FLU A&B, COVID)
Influenza A by PCR: NEGATIVE
Influenza B by PCR: NEGATIVE
SARS Coronavirus 2 by RT PCR: NEGATIVE

## 2019-11-17 LAB — MAGNESIUM: Magnesium: 2.1 mg/dL (ref 1.7–2.4)

## 2019-11-17 LAB — SARS-COV-2, NAA 2 DAY TAT

## 2019-11-17 LAB — MONONUCLEOSIS SCREEN: Mono Screen: NEGATIVE

## 2019-11-17 MED ORDER — POTASSIUM CHLORIDE CRYS ER 20 MEQ PO TBCR
40.0000 meq | EXTENDED_RELEASE_TABLET | Freq: Once | ORAL | Status: AC
  Administered 2019-11-17: 40 meq via ORAL
  Filled 2019-11-17: qty 2

## 2019-11-17 MED ORDER — POTASSIUM CHLORIDE ER 10 MEQ PO TBCR: 20.0000 meq | EXTENDED_RELEASE_TABLET | Freq: Every day | ORAL | 0 refills | Status: DC

## 2019-11-17 NOTE — ED Provider Notes (Signed)
[facility] EMERGENCY DEPARTMENT Provider Note   CSN: [id number] Arrival date & time: 11/17/19  1418     History Chief Complaint  Patient presents with  . Fatigue    Regina Martin is a 34 y.o. female.  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 She states that her symptoms have been severe enough to make her nearly bedbound for the past few days.  She states that yesterday and today she felt less fatigued and weak than prior.  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      Past Medical History:  Diagnosis Date  . Migraine     There are no problems to display for this patient.   Past Surgical History:  Procedure Laterality Date  . CHOLECYSTECTOMY    . SPLENECTOMY, PARTIAL       OB History   No obstetric history on file.     Family History  Problem Relation Age of Onset  . Healthy Mother   . Healthy Father     Social History   Tobacco Use  . Smoking status: Never Smoker  . Smokeless tobacco: Never Used  Substance Use Topics  . Alcohol use: Yes    Comment: occ  . Drug use: Never    Home Medications Prior to Admission medications   Medication Sig Start Date End Date Taking? Authorizing Provider  ibuprofen (ADVIL) 800 MG tablet Take 1 tablet (800 mg total) by mouth 3 (three) times daily. 09/06/19   Hall-Potvin, [place], PA-C  potassium chloride (KLOR-CON) 10 MEQ tablet Take 2 tablets (20 mEq total) by mouth daily. 11/17/19   Gailen Shelter, PA    Allergies    Contrast media [iodinated diagnostic agents]  Review of Systems   Review of Systems  Constitutional: Positive for activity change, appetite change  and fatigue. Negative for chills and fever.  HENT: Negative for congestion.   Eyes: Negative for pain.  Respiratory: Negative for cough and shortness of breath.   Cardiovascular: Negative for chest pain and leg swelling.  Gastrointestinal: Negative for abdominal pain and vomiting.  Genitourinary: Negative for dysuria.  Musculoskeletal: Negative for myalgias.  Skin: Negative for rash.  Neurological: Positive for headaches. Negative for dizziness.    Physical Exam Updated Vital Signs BP 112/74 (BP Location: Right Arm)   Pulse 96   Temp 98.6 F (37 C) (Oral)   Resp 15   Ht 5\' 3"  (1.6 m)   Wt 78 kg   LMP 10/25/2019   SpO2 100%   BMI 30.47 kg/m   Physical Exam Vitals and nursing note reviewed.  Constitutional:      General: She is not in acute distress.    Comments: Pleasant well-appearing 34 year old.  In no acute distress.  Sitting comfortably in bed.  Able answer questions appropriately follow commands. No increased work of breathing. Speaking in full sentences.  HENT:     Head: Normocephalic and atraumatic.     Nose: Nose normal.     Mouth/Throat:     Mouth: Mucous membranes are dry.  Eyes:     General: No scleral icterus.    Comments: PERRLA, EOMI, no scleral icterus  Cardiovascular:     Rate and  Rhythm: Normal rate and regular rhythm.     Pulses: Normal pulses.     Heart sounds: Normal heart sounds.  Pulmonary:     Effort: Pulmonary effort is normal. No respiratory distress.     Breath sounds: Normal breath sounds. No wheezing.  Abdominal:     Palpations: Abdomen is soft.     Tenderness: There is no abdominal tenderness.  Musculoskeletal:     Cervical back: Normal range of motion.     Right lower leg: No edema.     Left lower leg: No edema.  Skin:    General: Skin is warm and dry.     Capillary Refill: Capillary refill takes less than 2 seconds.  Neurological:     Mental Status: She is alert. Mental status is at baseline.  Psychiatric:        Mood and  Affect: Mood normal.        Behavior: Behavior normal.     ED Results / Procedures / Treatments   Labs (all labs ordered are listed, but only abnormal results are displayed) Labs Reviewed  CBC WITH DIFFERENTIAL/PLATELET - Abnormal; Notable for the following components:      Result Value   WBC 15.9 (*)    RBC 5.46 (*)    Hemoglobin 11.7 (*)    HCT 34.1 (*)    MCV 62.5 (*)    MCH 21.4 (*)    RDW 17.5 (*)    nRBC 0.3 (*)    Neutro Abs 10.8 (*)    Monocytes Absolute 1.3 (*)    Eosinophils Absolute 1.0 (*)    All other components within normal limits  COMPREHENSIVE METABOLIC PANEL - Abnormal; Notable for the following components:   Potassium 2.9 (*)    Chloride 97 (*)    AST 93 (*)    ALT 150 (*)    Alkaline Phosphatase 183 (*)    Total Bilirubin 1.4 (*)    All other components within normal limits  RESPIRATORY PANEL BY RT PCR (FLU A&B, COVID)  TSH  MAGNESIUM  PLATELET COUNT  MONONUCLEOSIS SCREEN    EKG None  Radiology US Abdomen Limited RUQ (LIVER/GB)  Result Date: 11/17/2019 CLINICAL DATA:  34 year old female with abnormal LFTs. EXAM: ULTRASOUND ABDOMEN LIMITED RIGHT UPPER QUADRANT COMPARISON:  None. FINDINGS: Gallbladder: Cholecystectomy. Common bile duct: Diameter: 3 mm Liver: No focal lesion identified. Within normal limits in parenchymal echogenicity. Portal vein is patent on color Doppler imaging with normal direction of blood flow towards the liver. Other: None. IMPRESSION: Cholecystectomy, otherwise unremarkable right upper quadrant ultrasound. Electronically Signed   By: Elgie Collard M.D.   On: 11/17/2019 18:38    Procedures Procedures (including critical care time)  Medications Ordered in ED Medications  potassium chloride SA (KLOR-CON) CR tablet 40 mEq (40 mEq Oral Given 11/17/19 1805)    ED Course  I have reviewed the triage vital signs and the nursing notes.  Pertinent labs & imaging results that were available during my care of the patient were  reviewed by me and considered in my medical decision making (see chart for details).    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  She was seen in urgent care at that time.  She states that her fatigue is somewhat improved over the last 24 hours.  She denies any nausea or vomiting any abdominal pain chest pain shortness of breath.  She is not been coughing significantly. She denies any sore throat  Physical exam is without any acute abnormal findings and her vital signs within normal limits.  Covid test is negative, mono negative, platelet count within normal limits, magnesium within normal months, she does have notable hypokalemia 2.9 orally repleted and given 3 days at discharge of oral potassium.  Her bilirubin has improved over the past 2 days from 4-1.4.  She still has persistently mildly elevated LFTs.  She understands the need for close follow-up for this. CBC is with persistent leukocytosis 15.9 today.  No significant anemia.  Low MCV however not significantly anemic.  Somewhat globally elevated WBC counts.  Ultrasound of the abdomen without any acute abnormality she is s/p cholecystectomy.  Liver is without any acute abnormality there is no hyper echoic lesions.  EKG without any acute abnormalities she is normal sinus rhythm with no significant axis deviations.  Normal R wave progression.  No ST-T wave abnormalities.  Normal sinus rhythm.  Patient will follow closely with her primary care doctor she is given close return precautions.  She is understanding of need for close monitoring of her symptoms.  I discussed this case with my attending physician who cosigned this note including  patient's presenting symptoms, physical exam, and planned diagnostics and interventions. Attending physician stated agreement with plan or made changes to plan which were implemented.   Regina Martin was evaluated in Emergency Department on 11/17/2019 for the symptoms described in the history of present illness. She was evaluated in the context of the global COVID-19 pandemic, which necessitated consideration that the patient might be at risk for infection with the SARS-CoV-2 virus that causes COVID-19. Institutional protocols and algorithms that pertain to the evaluation of patients at risk for COVID-19 are in a state of rapid change based on information released by regulatory bodies including the CDC and federal and state organizations. These policies and algorithms were followed during the patient's care in the ED.  Final Clinical Impression(s) / ED Diagnoses Final diagnoses:  LFT elevation  Fatigue, unspecified type  Leukocytosis, unspecified type  Hypokalemia    Rx / DC Orders ED Discharge Orders         Ordered    potassium chloride (KLOR-CON) 10 MEQ tablet  Daily        11/17/19 1846           Gailen Shelter, [place] 11/17/19 1944    Maia Plan, MD 11/17/19 2257

## 2019-11-17 NOTE — Discharge Instructions (Addendum)
Your liver enzyme was without any acute abnormalities and your blood work is no significant changes from prior studies.  Your mononucleosis screen was negative.  Please follow-up with your primary care doctor.  This very well may be some other viral illness however you do need to have close follow-up to ensure that your potassium levels and your liver function returns to normal. I have prescribed you 3 days of oral potassium to take.

## 2019-11-17 NOTE — ED Notes (Signed)
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 "just do not feel good" now has a very low grade fever of 99 degree F, Urgent Care did a covid test, as of this am was negative.

## 2019-11-17 NOTE — ED Triage Notes (Signed)
Pt arrives with c/o fatigue and body aches. Pt reports that she was covid tested states that it came back negative. Continues to have fatigue.

## 2019-11-17 NOTE — ED Notes (Signed)
AVS and Rx reviewed with pt, copy of AVS with Rx given to pt as well, Opportunity for questions provided, ED PA in to speak with pt re: lab results, his examination findings and recommendations

## 2019-11-17 NOTE — ED Notes (Signed)
ED PA informed of K level

## 2020-05-30 DIAGNOSIS — Z114 Encounter for screening for human immunodeficiency virus [HIV]: Secondary | ICD-10-CM | POA: Diagnosis not present

## 2020-05-30 DIAGNOSIS — Z1151 Encounter for screening for human papillomavirus (HPV): Secondary | ICD-10-CM | POA: Diagnosis not present

## 2020-05-30 DIAGNOSIS — Z1329 Encounter for screening for other suspected endocrine disorder: Secondary | ICD-10-CM | POA: Diagnosis not present

## 2020-05-30 DIAGNOSIS — Z1321 Encounter for screening for nutritional disorder: Secondary | ICD-10-CM | POA: Diagnosis not present

## 2020-05-30 DIAGNOSIS — E559 Vitamin D deficiency, unspecified: Secondary | ICD-10-CM | POA: Diagnosis not present

## 2020-05-30 DIAGNOSIS — Z8742 Personal history of other diseases of the female genital tract: Secondary | ICD-10-CM | POA: Diagnosis not present

## 2020-05-30 DIAGNOSIS — Z8759 Personal history of other complications of pregnancy, childbirth and the puerperium: Secondary | ICD-10-CM | POA: Diagnosis not present

## 2020-05-30 DIAGNOSIS — Z113 Encounter for screening for infections with a predominantly sexual mode of transmission: Secondary | ICD-10-CM | POA: Diagnosis not present

## 2020-05-30 DIAGNOSIS — Z01419 Encounter for gynecological examination (general) (routine) without abnormal findings: Secondary | ICD-10-CM | POA: Diagnosis not present

## 2020-05-30 DIAGNOSIS — Z803 Family history of malignant neoplasm of breast: Secondary | ICD-10-CM | POA: Diagnosis not present

## 2020-05-30 DIAGNOSIS — Z01411 Encounter for gynecological examination (general) (routine) with abnormal findings: Secondary | ICD-10-CM | POA: Diagnosis not present

## 2020-05-30 DIAGNOSIS — Z800 Family history of malignant neoplasm of digestive organs: Secondary | ICD-10-CM | POA: Diagnosis not present

## 2020-05-30 DIAGNOSIS — Z13220 Encounter for screening for lipoid disorders: Secondary | ICD-10-CM | POA: Diagnosis not present

## 2020-12-11 ENCOUNTER — Other Ambulatory Visit (HOSPITAL_COMMUNITY): Payer: Self-pay

## 2021-01-01 DIAGNOSIS — E559 Vitamin D deficiency, unspecified: Secondary | ICD-10-CM | POA: Diagnosis not present

## 2021-01-01 DIAGNOSIS — Z1151 Encounter for screening for human papillomavirus (HPV): Secondary | ICD-10-CM | POA: Diagnosis not present

## 2021-01-01 DIAGNOSIS — Z87898 Personal history of other specified conditions: Secondary | ICD-10-CM | POA: Diagnosis not present

## 2021-01-01 DIAGNOSIS — D649 Anemia, unspecified: Secondary | ICD-10-CM | POA: Diagnosis not present

## 2021-01-01 DIAGNOSIS — Z124 Encounter for screening for malignant neoplasm of cervix: Secondary | ICD-10-CM | POA: Diagnosis not present

## 2021-01-01 DIAGNOSIS — R87612 Low grade squamous intraepithelial lesion on cytologic smear of cervix (LGSIL): Secondary | ICD-10-CM | POA: Diagnosis not present

## 2021-01-22 IMAGING — US US ABDOMEN LIMITED
1 series · 14 of 25 positions shown · non-contrast
Comparison: None.

CLINICAL DATA: 34-year-old female with abnormal LFTs.

EXAM:
ULTRASOUND ABDOMEN LIMITED RIGHT UPPER QUADRANT

[Series 1: us abdomen limited · 14 of 31 slices shown]
[im 1/31]
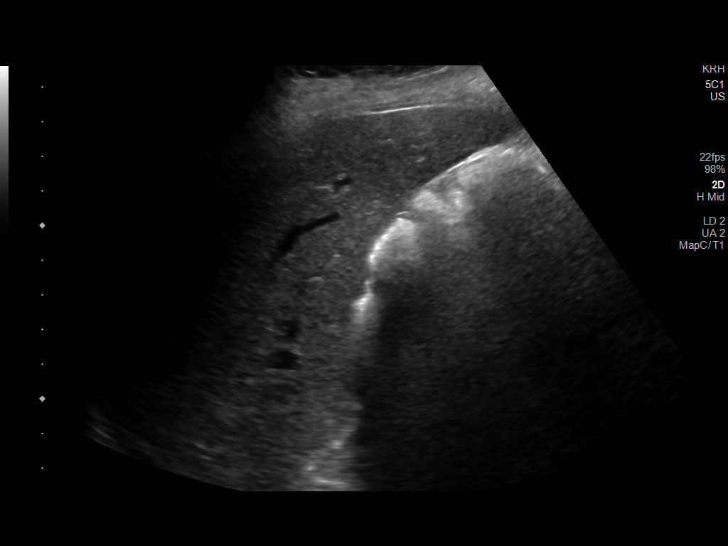
[im 3/31]
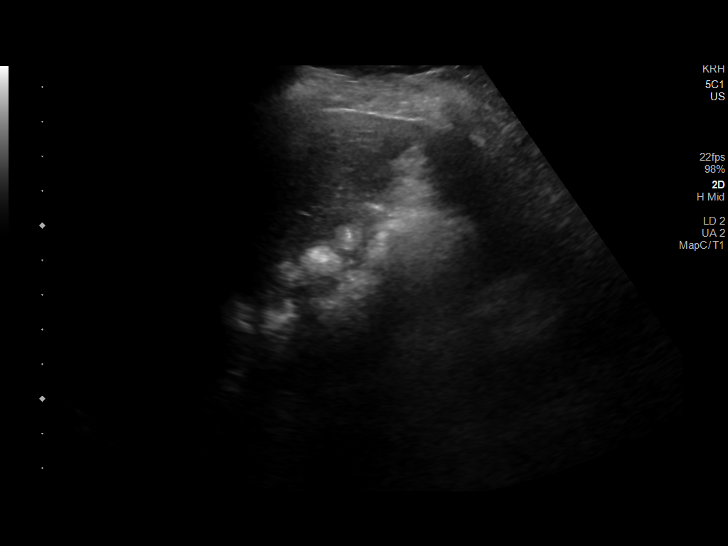
[im 6/31]
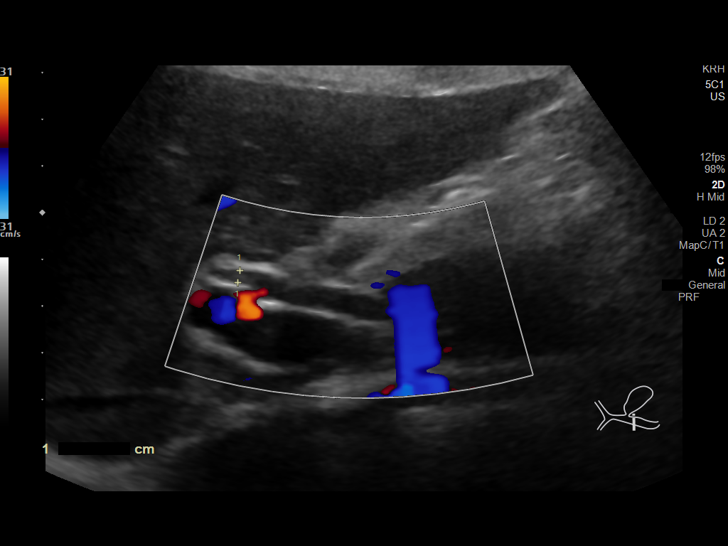
[im 8/31]
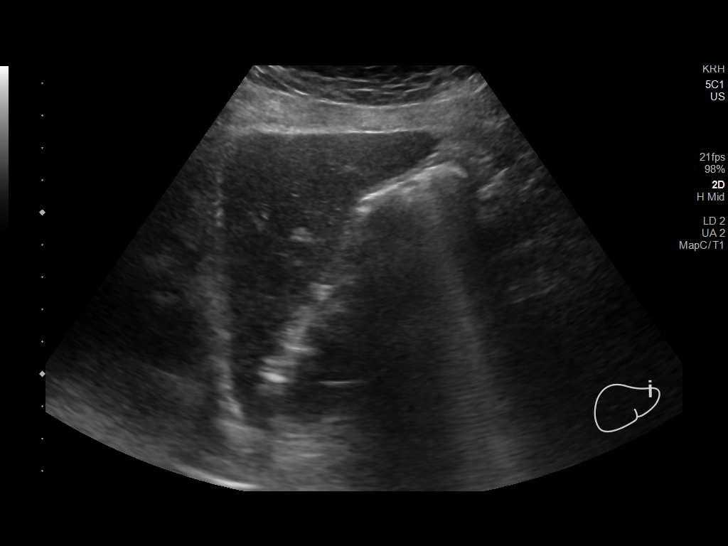
[im 11/31]
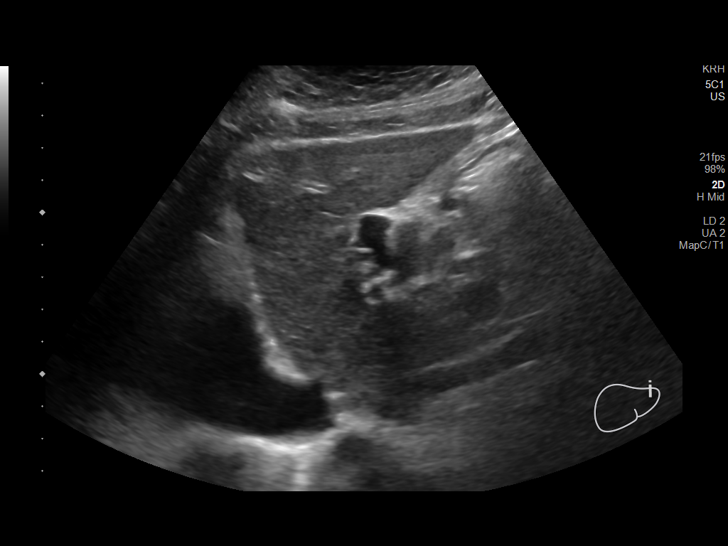
[im 12/31]
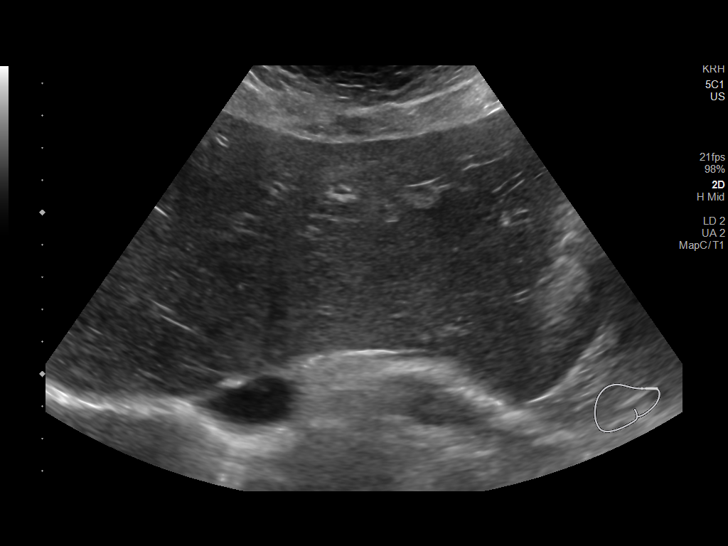
[im 14/31]
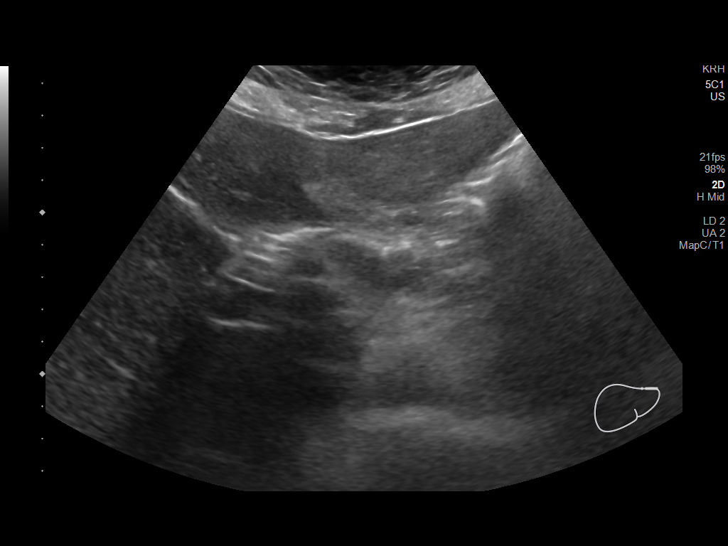
[im 17/31]
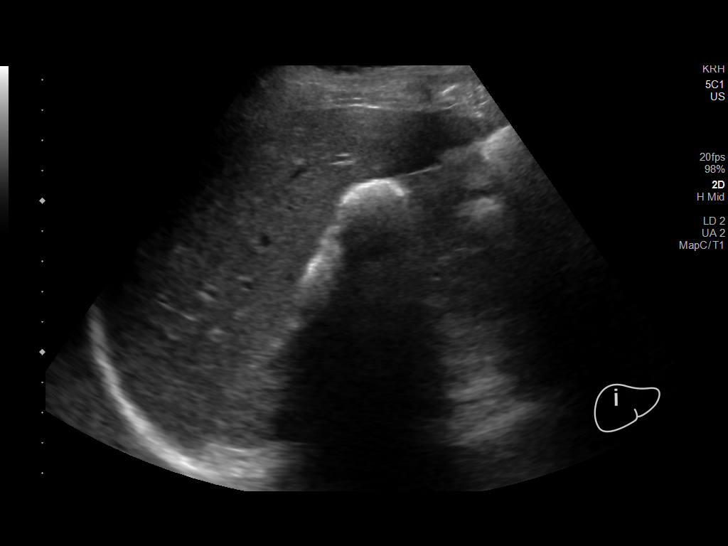
[im 19/31]
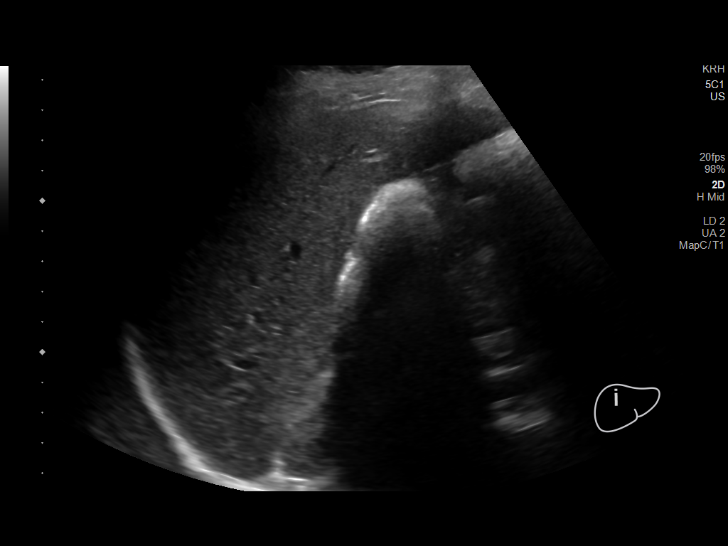
[im 21/31]
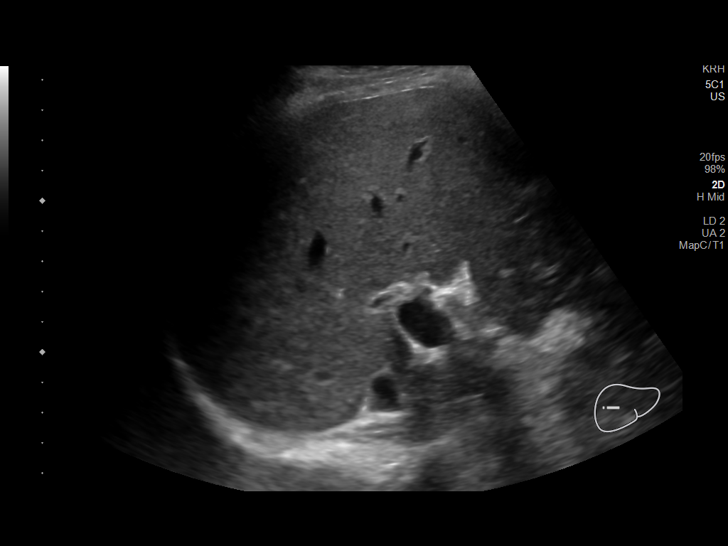
[im 23/31]
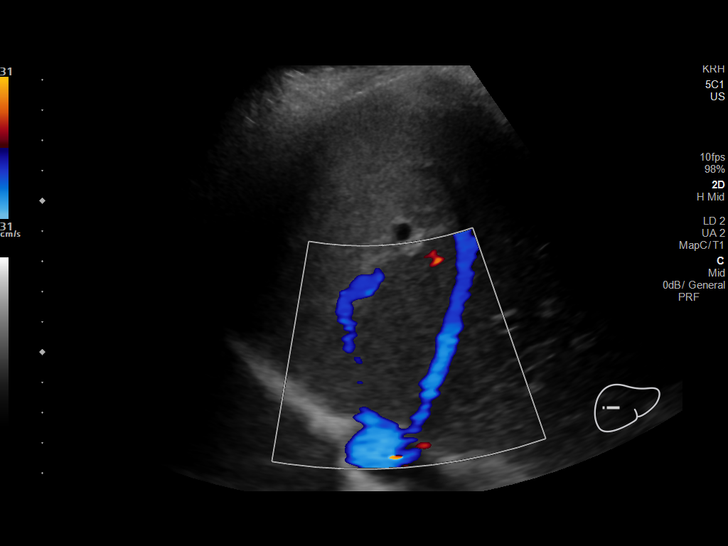
[im 26/31]
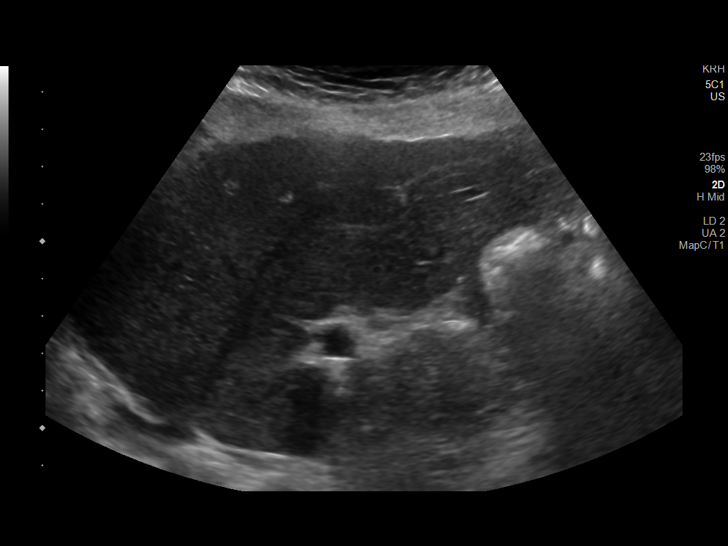
[im 28/31]
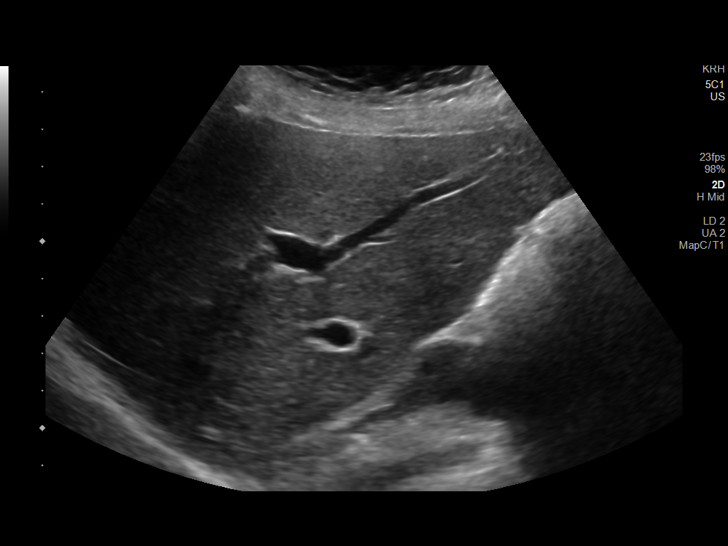
[im 31/31]
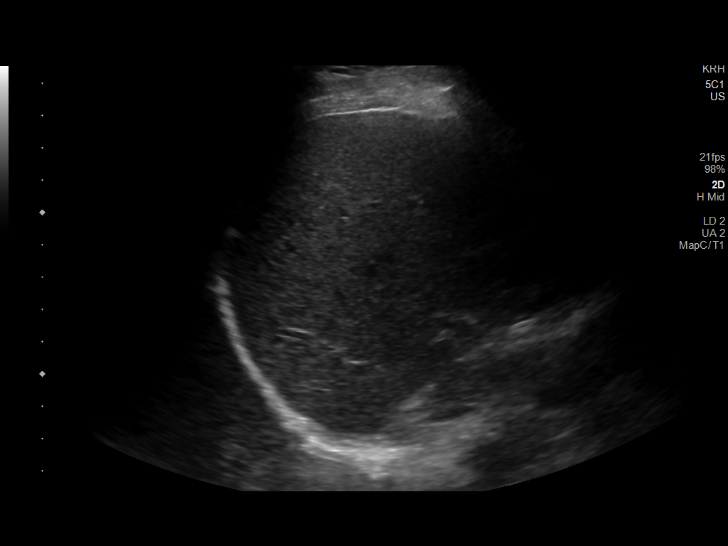

[14 of 25 positions shown; findings below may reference images not displayed]

FINDINGS: Gallbladder:

Cholecystectomy.

Common bile duct:

Diameter: 3 mm

Liver:

No focal lesion identified. Within normal limits in parenchymal
echogenicity. Portal vein is patent on color Doppler imaging with
normal direction of blood flow towards the liver.

Other: None.
IMPRESSION: Cholecystectomy, otherwise unremarkable right upper quadrant
ultrasound.

## 2021-03-23 ENCOUNTER — Encounter: Payer: Self-pay | Admitting: Radiology

## 2021-03-25 ENCOUNTER — Ambulatory Visit: Payer: 59 | Admitting: Family Medicine

## 2021-05-13 ENCOUNTER — Encounter: Payer: Self-pay | Admitting: Family Medicine

## 2021-05-13 ENCOUNTER — Ambulatory Visit (INDEPENDENT_AMBULATORY_CARE_PROVIDER_SITE_OTHER): Payer: BC Managed Care – PPO | Admitting: Family Medicine

## 2021-05-13 VITALS — BP 110/72 | HR 70 | Temp 97.6°F | Ht 63.0 in | Wt 206.0 lb

## 2021-05-13 DIAGNOSIS — D509 Iron deficiency anemia, unspecified: Secondary | ICD-10-CM

## 2021-05-13 DIAGNOSIS — R140 Abdominal distension (gaseous): Secondary | ICD-10-CM

## 2021-05-13 DIAGNOSIS — E559 Vitamin D deficiency, unspecified: Secondary | ICD-10-CM

## 2021-05-13 DIAGNOSIS — Z9049 Acquired absence of other specified parts of digestive tract: Secondary | ICD-10-CM | POA: Diagnosis not present

## 2021-05-13 DIAGNOSIS — K219 Gastro-esophageal reflux disease without esophagitis: Secondary | ICD-10-CM

## 2021-05-13 DIAGNOSIS — R5383 Other fatigue: Secondary | ICD-10-CM | POA: Diagnosis not present

## 2021-05-13 DIAGNOSIS — Z862 Personal history of diseases of the blood and blood-forming organs and certain disorders involving the immune mechanism: Secondary | ICD-10-CM | POA: Diagnosis not present

## 2021-05-13 LAB — COMPREHENSIVE METABOLIC PANEL
ALT: 13 U/L (ref 0–35)
AST: 17 U/L (ref 0–37)
Albumin: 4.5 g/dL (ref 3.5–5.2)
Alkaline Phosphatase: 73 U/L (ref 39–117)
BUN: 11 mg/dL (ref 6–23)
CO2: 27 mEq/L (ref 19–32)
Calcium: 9.4 mg/dL (ref 8.4–10.5)
Chloride: 105 mEq/L (ref 96–112)
Creatinine, Ser: 0.78 mg/dL (ref 0.40–1.20)
GFR: 98.84 mL/min (ref >60.00)
Glucose, Bld: 85 mg/dL (ref 70–99)
Potassium: 3.7 mEq/L (ref 3.5–5.1)
Sodium: 138 mEq/L (ref 135–145)
Total Bilirubin: 0.4 mg/dL (ref 0.2–1.2)
Total Protein: 7.9 g/dL (ref 6.0–8.3)

## 2021-05-13 LAB — CBC WITH DIFFERENTIAL/PLATELET
Basophils Relative: 0 % (ref 0.0–3.0)
Eosinophils Relative: 13 % — ABNORMAL HIGH (ref 0.0–5.0)
HCT: 35.3 % — ABNORMAL LOW (ref 36.0–46.0)
Hemoglobin: 10.8 g/dL — ABNORMAL LOW (ref 12.0–15.0)
Lymphocytes Relative: 45 % (ref 12.0–46.0)
MCHC: 30.7 g/dL (ref 30.0–36.0)
MCV: 67.7 fl — ABNORMAL LOW (ref 78.0–100.0)
Monocytes Relative: 10 % (ref 3.0–12.0)
Neutrophils Relative %: 32 % — ABNORMAL LOW (ref 43.0–77.0)
Platelets: 572 10*3/uL — ABNORMAL HIGH (ref 150.0–400.0)
RBC: 5.21 Mil/uL — ABNORMAL HIGH (ref 3.87–5.11)
RDW: 19.6 % — ABNORMAL HIGH (ref 11.5–15.5)
WBC: 8.6 10*3/uL (ref 4.0–10.5)

## 2021-05-13 LAB — T4, FREE: Free T4: 0.71 ng/dL (ref 0.60–1.60)

## 2021-05-13 LAB — VITAMIN B12: Vitamin B-12: 400 pg/mL (ref 211–911)

## 2021-05-13 LAB — TSH: TSH: 1.52 u[IU]/mL (ref 0.35–5.50)

## 2021-05-13 LAB — VITAMIN D 25 HYDROXY (VIT D DEFICIENCY, FRACTURES): VITD: 42.7 ng/mL (ref 30.00–100.00)

## 2021-05-13 NOTE — Assessment & Plan Note (Signed)
Check vitamin D level and follow-up 

## 2021-05-13 NOTE — Progress Notes (Signed)
? ?New Patient Office Visit ? ?Subjective   ? ?Patient ID: Regina Martin, female    DOB: 1985-10-02  Age: 36 y.o. MRN: [id number] ? ?CC:  ?Chief Complaint  ?Patient presents with  ? Establish Care  ?  Would like to discuss her weight gain since gallbladder removal   ? ? ?HPI ?Regina Martin presents to establish care ? ?Gallbladder removed in 2020. States since then she has been feeling bloated. Reports weight gain.  ? ?GERD-triggers are red sauces and spicy foods  ? ?States she has been grieving from the death of her father. States she has been having a tough time and may have been eating more since.  ? ?Hx of splenectomy  ?Hx of anemia, ITP ? ?Reports feeling tired.  ?Hx of vitamin D def ? ?Denies fever, chills, dizziness, chest pain, palpitations, shortness of breath, N/V/D, urinary symptoms, LE edema.  ? ? ?Last pap smear normal.  ?Regular periods, last 7 days. No heavy bleeding.  ? ?Takes Relpax for migraines with her menses.  ? ?Married, 2 kids ages 32 and 20.  ?Is not on birth control.  ? ? ? ? ?Outpatient Encounter Medications as of 05/13/2021  ?Medication Sig  ? Cetirizine HCl (ZYRTEC ALLERGY PO) Take by mouth.  ? famotidine (PEPCID) 20 MG tablet Take 20 mg by mouth 2 (two) times daily.  ? [DISCONTINUED] ibuprofen (ADVIL) 800 MG tablet Take 1 tablet (800 mg total) by mouth 3 (three) times daily.  ? [DISCONTINUED] ibuprofen (ADVIL,MOTRIN) 800 MG tablet Take 800 mg by mouth every 8 (eight) hours as needed for pain.  ? [DISCONTINUED] potassium chloride (KLOR-CON) 10 MEQ tablet Take 2 tablets (20 mEq total) by mouth daily.  ? [DISCONTINUED] ranitidine (ZANTAC) 150 MG tablet Take 150 mg by mouth 2 (two) times daily.  ? ?No facility-administered encounter medications on file as of 05/13/2021.  ? ? ?Past Medical History:  ?Diagnosis Date  ? Abnormal Pap smear   ? lsil  ? Collagen vascular disease (HCC)   ? History of ITP   ? Migraine   ? ? ?Past Surgical History:  ?Procedure Laterality Date  ? CHOLECYSTECTOMY     ? spleenectomy  1992  ? SPLENECTOMY, PARTIAL    ? ? ?Family History  ?Problem Relation Age of Onset  ? Healthy Mother   ? Healthy Father   ? Seizures Mother   ? ? ?Social History  ? ?Socioeconomic History  ? Marital status: Married  ?  Spouse name: Not on file  ? Number of children: Not on file  ? Years of education: Not on file  ? Highest education level: Not on file  ?Occupational History  ? Not on file  ?Tobacco Use  ? Smoking status: Never  ? Smokeless tobacco: Never  ?Substance and Sexual Activity  ? Alcohol use: Yes  ?  Comment: occ  ? Drug use: Never  ? Sexual activity: Yes  ?  Birth control/protection: None, Pill  ?Other Topics Concern  ? Not on file  ?Social History Narrative  ? ** Merged History Encounter **  ?    ? ?Social Determinants of Health  ? ?Financial Resource Strain: Not on file  ?Food Insecurity: Not on file  ?Transportation Needs: Not on file  ?Physical Activity: Not on file  ?Stress: Not on file  ?Social Connections: Not on file  ?Intimate Partner Violence: Not on file  ? ? ?ROS ?Pertinent positives and negatives in the history of present illness. ? ?  ? ? ?Objective   ? ?  BP 110/72 (BP Location: Left Arm, Patient Position: Sitting, Cuff Size: Large)   Pulse 70   Temp 97.6 ?F (36.4 ?C) (Temporal)   Ht 5\' 3"  (1.6 m)   Wt 206 lb (93.4 kg)   SpO2 96%   BMI 36.49 kg/m?  ? ?Physical Exam ?Constitutional:   ?   General: She is not in acute distress. ?   Appearance: She is not ill-appearing.  ?Eyes:  ?   Conjunctiva/sclera: Conjunctivae normal.  ?   Pupils: Pupils are equal, round, and reactive to light.  ?Cardiovascular:  ?   Rate and Rhythm: Normal rate and regular rhythm.  ?Pulmonary:  ?   Effort: Pulmonary effort is normal.  ?   Breath sounds: Normal breath sounds.  ?Abdominal:  ?   General: Abdomen is flat. Bowel sounds are normal.  ?   Palpations: Abdomen is soft.  ?   Tenderness: There is no abdominal tenderness. There is no guarding or rebound.  ?Skin: ?   General: Skin is warm and dry.   ?Neurological:  ?   General: No focal deficit present.  ?   Mental Status: She is alert and oriented to person, place, and time.  ?Psychiatric:     ?   Mood and Affect: Mood normal.     ?   Behavior: Behavior normal.  ? ? ? ?  ? ?Assessment & Plan:  ? ?Problem List Items Addressed This Visit   ? ?  ? Digestive  ? Gastroesophageal reflux disease  ?  Avoid triggers. Continue taking Pepcid. Referral to GI ? ?  ?  ? Relevant Medications  ? famotidine (PEPCID) 20 MG tablet  ? Other Relevant Orders  ? Ambulatory referral to Gastroenterology  ?  ? Other  ? Abdominal bloating - Primary  ?  Persistent and worsening since gallbladder removed. Try probiotic. Low fat diet.  ?Referral to GI ? ?  ?  ? Relevant Orders  ? CBC with Differential/Platelet (Completed)  ? Comprehensive metabolic panel (Completed)  ? TSH (Completed)  ? T4, free (Completed)  ? Ambulatory referral to Gastroenterology  ? Fatigue  ?  May be multifactorial. Check labs. Recommend scheduling with a therapist for grief counseling.  ? ?  ?  ? Relevant Orders  ? CBC with Differential/Platelet (Completed)  ? Comprehensive metabolic panel (Completed)  ? TSH (Completed)  ? T4, free (Completed)  ? VITAMIN D 25 Hydroxy (Vit-D Deficiency, Fractures) (Completed)  ? Vitamin B12 (Completed)  ? Iron, TIBC and Ferritin Panel  ? History of ITP  ? Iron deficiency anemia  ?  Check labs and replace iron as needed.  ? ?  ?  ? Relevant Orders  ? CBC with Differential/Platelet (Completed)  ? Iron, TIBC and Ferritin Panel  ? Vitamin D deficiency  ?  Check vitamin D level and follow up ? ?  ?  ? Relevant Orders  ? VITAMIN D 25 Hydroxy (Vit-D Deficiency, Fractures) (Completed)  ? ?Other Visit Diagnoses   ? ? History of cholecystectomy      ? Relevant Orders  ? Ambulatory referral to Gastroenterology  ? ?  ? ? ?No follow-ups on file.  ? ? , NP-C ? ? ?

## 2021-05-13 NOTE — Assessment & Plan Note (Signed)
Avoid triggers. Continue taking Pepcid. Referral to GI ?

## 2021-05-13 NOTE — Assessment & Plan Note (Signed)
Persistent and worsening since gallbladder removed. Try probiotic. Low fat diet.  ?Referral to GI ?

## 2021-05-13 NOTE — Assessment & Plan Note (Signed)
May be multifactorial. Check labs. Recommend scheduling with a therapist for grief counseling.  ?

## 2021-05-13 NOTE — Patient Instructions (Addendum)
Start a probiotic such as [employer], academic, [employer] or others.  ? ?Continue Pepcid.  ? ?Avoid foods that trigger reflux or bloating.  ? ?You will hear from [facility]  ? ?Schedule with a therapist  ? ?We will be in touch with your results.  ? ? ?

## 2021-05-13 NOTE — Assessment & Plan Note (Signed)
Check labs and replace iron as needed.  ?

## 2021-05-14 LAB — IRON,TIBC AND FERRITIN PANEL
%SAT: 5 % (calc) — ABNORMAL LOW (ref 16–45)
Ferritin: 12 ng/mL — ABNORMAL LOW (ref 16–154)
Iron: 20 ug/dL — ABNORMAL LOW (ref 40–190)
TIBC: 436 mcg/dL (calc) (ref 250–450)

## 2021-05-14 NOTE — Progress Notes (Signed)
Please reach out to her with my recommendations and schedule her for a follow up in 6-7 weeks.

## 2021-07-21 ENCOUNTER — Encounter: Payer: Self-pay | Admitting: Gastroenterology

## 2021-08-13 ENCOUNTER — Telehealth: Payer: Self-pay | Admitting: Gastroenterology

## 2021-08-13 NOTE — Telephone Encounter (Signed)
Called and left a voicemail & sent her a my chart message as well.

## 2021-08-19 ENCOUNTER — Ambulatory Visit: Payer: Commercial Managed Care - PPO | Admitting: Gastroenterology

## 2021-10-01 ENCOUNTER — Encounter: Payer: Self-pay | Admitting: Gastroenterology

## 2021-10-01 ENCOUNTER — Ambulatory Visit: Payer: BC Managed Care – PPO | Admitting: Gastroenterology

## 2021-10-01 VITALS — BP 114/76 | HR 82 | Ht 63.0 in | Wt 213.0 lb

## 2021-10-01 DIAGNOSIS — R140 Abdominal distension (gaseous): Secondary | ICD-10-CM | POA: Diagnosis not present

## 2021-10-01 DIAGNOSIS — R1013 Epigastric pain: Secondary | ICD-10-CM

## 2021-10-01 MED ORDER — PANTOPRAZOLE SODIUM 40 MG PO TBEC: 40.0000 mg | DELAYED_RELEASE_TABLET | Freq: Two times a day (BID) | ORAL | 2 refills | Status: DC

## 2021-10-01 NOTE — Progress Notes (Signed)
10/01/2021 Regina Martin [id number] 1985/09/05   HISTORY OF PRESENT ILLNESS: This is a pleasant 36 year old female who is new to our office.  She been referred here by Mack Hook, NP, for evaluation of GERD and abdominal bloating.  The patient tells me that she had her gallbladder removed for borderline gallbladder dysfunction in early 2020.  She says that prior to that they performed an EGD in February 2020 to be sure there was no other source.  That only showed a small hiatal hernia and gastric biopsies were normal.  She says that she has been experiencing the same epigastric abdominal pain and symptoms that she was experiencing prior to having her gallbladder removed (all done through [facility] because that is where she worked at the time).  She says that she is very bloated after she eats especially anything with dairy or with a high fat content.  Has intermittent nausea.  She says that she had been diagnosed with H. pylori several years ago and was treated for that.  She is on ferrous sulfate 325 mg 3 times daily for the last few months prescribed by her PCP for treatment of iron deficiency anemia.  This is thought to be due to GYN losses.  No sign of GI bleeding.   Past Medical History:  Diagnosis Date   Abnormal Pap smear    lsil   Anemia    Collagen vascular disease ([place])    History of ITP    Migraine    Past Surgical History:  Procedure Laterality Date   CHOLECYSTECTOMY     spleenectomy  1992   SPLENECTOMY, PARTIAL      reports that she has never smoked. She has never used smokeless tobacco. She reports current alcohol use. She reports that she does not use drugs. family history includes Colon cancer in her maternal great-grandmother; Healthy in her father and mother; Seizures in her mother. Allergies  Allergen Reactions   Contrast Media [Iodinated Contrast Media] Hives      Outpatient Encounter Medications as of 10/01/2021  Medication Sig   Cetirizine HCl (ZYRTEC  ALLERGY PO) Take by mouth.   famotidine (PEPCID) 20 MG tablet Take 20 mg by mouth 2 (two) times daily.   ferrous sulfate 325 (65 FE) MG EC tablet Take 325 mg by mouth 3 (three) times daily with meals.   NON FORMULARY Hair skin and nail vitamin 2 a day   No facility-administered encounter medications on file as of 10/01/2021.     REVIEW OF SYSTEMS  : All other systems reviewed and negative except where noted in the History of Present Illness.   PHYSICAL EXAM: BP 114/76   Pulse 82   Ht 5\' 3"  (1.6 m)   Wt 213 lb (96.6 kg)   BMI 37.73 kg/m  General: Well developed female in no acute distress Head: Normocephalic and atraumatic Eyes:  Sclerae anicteric, conjunctiva pink. Ears: Normal auditory acuity Lungs: Clear throughout to auscultation; no W/R/R. Heart: Regular rate and rhythm; no M/R/G. Abdomen: Soft, non-distended.  BS present.  Non-tender. Musculoskeletal: Symmetrical with no gross deformities  Skin: No lesions on visible extremities Extremities: No edema  Neurological: Alert oriented x 4, grossly non-focal Psychological:  Alert and cooperative. Normal mood and affect  ASSESSMENT AND PLAN: *Epigastric abdominal pain and bloating: Describes this as the pain same pain that she experienced prior to her cholecystectomy.  Had an EGD just prior to her cholecystectomy in 2020 that was unremarkable except for a small hiatal hernia.  She is not on any PPI therapy so I am going to put her on pantoprazole 40 mg twice daily for the next 4 to 6 weeks.  I would also like her to try lactose-free diet in that time as well.  Prescription sent to her pharmacy.  She will then message [place] back and give [place] an update on her symptoms.  If symptoms are persistent then would consider repeat EGD versus CT scan imaging. *Iron deficiency anemia: Likely due to GYN losses.  She is being treated with ferrous sulfate 3 times daily by her PCP.  Recommend that she follow-up with GYN as well.   CC:  Henson, Vickie  L, NP-C

## 2021-10-01 NOTE — Patient Instructions (Signed)
We have sent the following medications to your pharmacy for you to pick up at your convenience: Pantoprazole , message [place] in 4--5 weeks with an update please.  We are providing you with lactose free diet.  Due to recent changes in healthcare laws, you may see the results of your imaging and laboratory studies on MyChart before your provider has had a chance to review them.  We understand that in some cases there may be results that are confusing or concerning to you. Not all laboratory results come back in the same time frame and the provider may be waiting for multiple results in order to interpret others.  Please give [place] 48 hours in order for your provider to thoroughly review all the results before contacting the office for clarification of your results.   _______________________________________________________  If you are age 75 or older, your body mass index should be between 23-30. Your Body mass index is 37.73 kg/m. If this is out of the aforementioned range listed, please consider follow up with your Primary Care Provider.  If you are age 62 or younger, your body mass index should be between 19-25. Your Body mass index is 37.73 kg/m. If this is out of the aformentioned range listed, please consider follow up with your Primary Care Provider.   ________________________________________________________  The [facility] providers would like to encourage you to use [facility] to communicate with providers for non-urgent requests or questions.  Due to long hold times on the telephone, sending your provider a message by [facility] may be a faster and more efficient way to get a response.  Please allow 48 business hours for a response.  Please remember that this is for non-urgent requests.  _______________________________________________________    I appreciate the opportunity to care for you. Alonza Bogus, PA-C

## 2021-10-08 NOTE — Progress Notes (Signed)
Addendum: Reviewed and agree with assessment and management plan. Donnette Macmullen M, MD  

## 2021-11-06 ENCOUNTER — Telehealth (INDEPENDENT_AMBULATORY_CARE_PROVIDER_SITE_OTHER): Payer: BC Managed Care – PPO | Admitting: Family Medicine

## 2021-11-06 DIAGNOSIS — D509 Iron deficiency anemia, unspecified: Secondary | ICD-10-CM | POA: Diagnosis not present

## 2021-11-06 DIAGNOSIS — K219 Gastro-esophageal reflux disease without esophagitis: Secondary | ICD-10-CM | POA: Diagnosis not present

## 2021-11-06 NOTE — Progress Notes (Signed)
MyChart Video Visit    Virtual Visit via Video Note   This visit type was conducted due to national recommendations for restrictions regarding the COVID-19 Pandemic (e.g. social distancing) in an effort to limit this patient's exposure and mitigate transmission in our community. This patient is at least at moderate risk for complications without adequate follow up. This format is felt to be most appropriate for this patient at this time. Physical exam was limited by quality of the video and audio technology used for the visit. CMA was able to get the patient set up on a video visit.  Patient location: Home. Patient and provider in visit Provider location: Office  I discussed the limitations of evaluation and management by telemedicine and the availability of in person appointments. The patient expressed understanding and agreed to proceed.  Visit Date: 11/06/2021  Today's healthcare provider: Hetty Blend, NP-C     Subjective:    Patient ID: Regina Martin, female    DOB: Feb 06, 1985, 36 y.o.   MRN: [id number]  Chief Complaint  Patient presents with   Follow-up    F/u iron levels    HPI  Patient changed her in office visit to virtual.  She will come in next week for labs.  Iron deficiency anemia most likely related to menses.  She is taking high-dose iron once daily.  No issues.  She saw GI for anemia and for GERD.  She is taking pantoprazole and symptoms are improving.  She continues having fatigue but slightly improved.  No new symptoms.  No fever, chills, dizziness, chest pain, palpitations, shortness of breath, vomiting or diarrhea.     Past Medical History:  Diagnosis Date   Abnormal Pap smear    lsil   Anemia    Collagen vascular disease (HCC)    History of ITP    Migraine     Past Surgical History:  Procedure Laterality Date   CHOLECYSTECTOMY     spleenectomy  1992   SPLENECTOMY, PARTIAL      Family History  Problem Relation Age of Onset    Healthy Mother    Seizures Mother    Healthy Father    Colon cancer Maternal Great-grandmother    Esophageal cancer Neg Hx    Stomach cancer Neg Hx     Social History   Socioeconomic History   Marital status: Married    Spouse name: Not on file   Number of children: 2   Years of education: Not on file   Highest education level: Not on file  Occupational History   Occupation: cma  Tobacco Use   Smoking status: Never   Smokeless tobacco: Never  Vaping Use   Vaping Use: Never used  Substance and Sexual Activity   Alcohol use: Yes    Comment: occ   Drug use: Never   Sexual activity: Yes    Birth control/protection: None, Pill  Other Topics Concern   Not on file  Social History Narrative   ** Merged History Encounter **       Social Determinants of Health   Financial Resource Strain: Not on file  Food Insecurity: Not on file  Transportation Needs: Not on file  Physical Activity: Not on file  Stress: Not on file  Social Connections: Not on file  Intimate Partner Violence: Not on file    Outpatient Medications Prior to Visit  Medication Sig Dispense Refill   Cetirizine HCl (ZYRTEC ALLERGY PO) Take by mouth.     ferrous  sulfate 325 (65 FE) MG EC tablet Take 325 mg by mouth 3 (three) times daily with meals.     NON FORMULARY Hair skin and nail vitamin 2 a day     pantoprazole (PROTONIX) 40 MG tablet Take 1 tablet (40 mg total) by mouth 2 (two) times daily before a meal. 60 tablet 2   famotidine (PEPCID) 20 MG tablet Take 20 mg by mouth 2 (two) times daily.     No facility-administered medications prior to visit.    Allergies  Allergen Reactions   Contrast Media [Iodinated Contrast Media] Hives    ROS     Objective:    Physical Exam  There were no vitals taken for this visit. Wt Readings from Last 3 Encounters:  10/01/21 213 lb (96.6 kg)  05/13/21 206 lb (93.4 kg)  11/17/19 172 lb (78 kg)   Alert and oriented and in no acute distress.  Respirations  unlabored.  Normal speech, normal mood.     Assessment & Plan:   Problem List Items Addressed This Visit       Digestive   Gastroesophageal reflux disease     Other   Iron deficiency anemia - Primary   Relevant Orders   CBC with Differential/Platelet   Iron, TIBC and Ferritin Panel   Virtual visit to discuss iron deficiency anemia and GERD.  She seems to be doing better overall.  Continue taking daily iron and pantoprazole.  She did see GI since her last visit.  No further testing needed per GI. She will follow-up here next week for a lab visit only to recheck CBC and iron studies.  I have discontinued [employer]'s famotidine. I am also having her maintain her Cetirizine HCl (ZYRTEC ALLERGY PO), ferrous sulfate, NON FORMULARY, and pantoprazole.  No orders of the defined types were placed in this encounter.   I discussed the assessment and treatment plan with the patient. The patient was provided an opportunity to ask questions and all were answered. The patient agreed with the plan and demonstrated an understanding of the instructions.   The patient was advised to call back or seek an in-person evaluation if the symptoms worsen or if the condition fails to improve as anticipated.  I provided 10 minutes of face-to-face time during this encounter.   Harland Dingwall, NP-C [facility] [place] [phone] (phone) [phone] (fax)  [place]

## 2021-12-04 ENCOUNTER — Other Ambulatory Visit (INDEPENDENT_AMBULATORY_CARE_PROVIDER_SITE_OTHER): Payer: BC Managed Care – PPO

## 2021-12-04 DIAGNOSIS — D509 Iron deficiency anemia, unspecified: Secondary | ICD-10-CM

## 2021-12-04 LAB — CBC WITH DIFFERENTIAL/PLATELET
Basophils Absolute: 0.1 10*3/uL (ref 0.0–0.1)
Basophils Relative: 0 % (ref 0.0–3.0)
Eosinophils Absolute: 0.1 10*3/uL (ref 0.0–0.7)
Eosinophils Relative: 0 % (ref 0.0–5.0)
HCT: 37.3 % (ref 36.0–46.0)
Hemoglobin: 12 g/dL (ref 12.0–15.0)
Lymphocytes Relative: 45 % (ref 12.0–46.0)
Lymphs Abs: 4 10*3/uL (ref 0.7–4.0)
MCHC: 32.1 g/dL (ref 30.0–36.0)
MCV: 69.5 fl — ABNORMAL LOW (ref 78.0–100.0)
Monocytes Absolute: 0.6 10*3/uL (ref 0.1–1.0)
Monocytes Relative: 12 % (ref 3.0–12.0)
Neutrophils Relative %: 43 % (ref 43.0–77.0)
Platelets: 542 10*3/uL — ABNORMAL HIGH (ref 150.0–400.0)
RBC: 5.37 Mil/uL — ABNORMAL HIGH (ref 3.87–5.11)
RDW: 17.6 % — ABNORMAL HIGH (ref 11.5–15.5)
WBC: 8.6 10*3/uL (ref 4.0–10.5)

## 2021-12-05 LAB — IRON,TIBC AND FERRITIN PANEL
%SAT: 9 % (calc) — ABNORMAL LOW (ref 16–45)
Ferritin: 12 ng/mL — ABNORMAL LOW (ref 16–154)
Iron: 35 ug/dL — ABNORMAL LOW (ref 40–190)
TIBC: 398 mcg/dL (calc) (ref 250–450)

## 2021-12-07 NOTE — Progress Notes (Signed)
Please reach out and find out how much iron she is taking AND is she taking iron daily or how many days per week for the past month? Her iron is low. She needs to be on over the counter iron once daily everyday and follow up with me in office in 6-8 weeks. If her iron level does not improve, then I will refer her to hematology.

## 2021-12-19 ENCOUNTER — Ambulatory Visit
Admission: RE | Admit: 2021-12-19 | Discharge: 2021-12-19 | Disposition: A | Discharge status: Home / Self Care | Payer: BC Managed Care – PPO | Source: Ambulatory Visit | Attending: Emergency Medicine | Admitting: Emergency Medicine

## 2021-12-19 VITALS — BP 116/81 | HR 94 | Temp 98.7°F | Resp 18 | Ht 63.0 in | Wt 198.0 lb

## 2021-12-19 DIAGNOSIS — J020 Streptococcal pharyngitis: Secondary | ICD-10-CM | POA: Diagnosis not present

## 2021-12-19 LAB — POCT RAPID STREP A (OFFICE): Rapid Strep A Screen: POSITIVE — AB

## 2021-12-19 MED ORDER — PENICILLIN G BENZATHINE 1200000 UNIT/2ML IM SUSY
1.2000 10*6.[IU] | PREFILLED_SYRINGE | Freq: Once | INTRAMUSCULAR | Status: AC
  Administered 2021-12-19: 1.2 10*6.[IU] via INTRAMUSCULAR

## 2021-12-19 NOTE — Discharge Instructions (Addendum)
You were given an injection of a long-acting penicillin today to treat strep throat.  No other antibiotic is needed.    Take Tylenol or ibuprofen as needed for fever or discomfort.  Rest and keep yourself hydrated.    Follow-up with your primary care provider if your symptoms are not improving.

## 2021-12-19 NOTE — ED Triage Notes (Signed)
Patient to Urgent Care with complaints of sore throat that started yesterday. Denies any known fevers- reports some chills and sweats.

## 2021-12-19 NOTE — ED Provider Notes (Signed)
Roderic Palau    CSN: [id number] Arrival date & time: 12/19/21  1104      History   Chief Complaint Chief Complaint  Patient presents with   Sore Throat    Entered by patient    HPI Regina Martin is a 36 y.o. female.  Patient presents with 1 day history of chills and sore throat.  She denies fever, rash, cough, shortness of breath, vomiting, diarrhea, or other symptoms.  No OTC medications taken today.  Her medical history includes anemia, migraine headaches, GERD.   The history is provided by the patient and medical records.    Past Medical History:  Diagnosis Date   Abnormal Pap smear    lsil   Anemia    Collagen vascular disease ([place])    History of ITP    Migraine     Patient Active Problem List   Diagnosis Date Noted   Abdominal pain, epigastric 10/01/2021   Abdominal bloating 05/13/2021   Gastroesophageal reflux disease 05/13/2021   Iron deficiency anemia 05/13/2021   Fatigue 05/13/2021   Vitamin D deficiency 05/13/2021   CIN I (cervical intraepithelial neoplasia I) 11/11/2010   Abnormal Pap smear    History of ITP     Past Surgical History:  Procedure Laterality Date   CHOLECYSTECTOMY     spleenectomy  1992   SPLENECTOMY, PARTIAL      OB History     Gravida  1   Para  1   Term  1   Preterm  0   AB  0   Living         SAB  0   IAB  0   Ectopic  0   Multiple      Live Births               Home Medications    Prior to Admission medications   Medication Sig Start Date End Date Taking? Authorizing Provider  Cetirizine HCl (ZYRTEC ALLERGY PO) Take by mouth.    [provider]  ferrous sulfate 325 (65 FE) MG EC tablet Take 325 mg by mouth 3 (three) times daily with meals.    [provider]  NON FORMULARY Hair skin and nail vitamin 2 a day    [provider]  pantoprazole (PROTONIX) 40 MG tablet Take 1 tablet (40 mg total) by mouth 2 (two) times daily before a meal. 10/01/21   Zehr,  Laban Emperor, PA-C    Family History Family History  Problem Relation Age of Onset   Healthy Mother    Seizures Mother    Healthy Father    Colon cancer Maternal Great-grandmother    Esophageal cancer Neg Hx    Stomach cancer Neg Hx     Social History Social History   Tobacco Use   Smoking status: Never   Smokeless tobacco: Never  Vaping Use   Vaping Use: Never used  Substance Use Topics   Alcohol use: Yes    Comment: occ   Drug use: Never     Allergies   Contrast media [iodinated contrast media]   Review of Systems Review of Systems  Constitutional:  Positive for chills. Negative for fever.  HENT:  Positive for sore throat. Negative for ear pain.   Respiratory:  Negative for cough and shortness of breath.   Cardiovascular:  Negative for chest pain and palpitations.  Gastrointestinal:  Negative for diarrhea and vomiting.  Skin:  Negative for color change and rash.  All other systems reviewed and are negative.    Physical Exam Triage Vital Signs ED Triage Vitals  Enc Vitals Group     BP      Pulse      Resp      Temp      Temp src      SpO2      Weight      Height      Head Circumference      Peak Flow      Pain Score      Pain Loc      Pain Edu?      Excl. in [place]?    No data found.  Updated Vital Signs BP 116/81   Pulse 94   Temp 98.7 F (37.1 C)   Resp 18   Ht 5\' 3"  (1.6 m)   Wt 198 lb (89.8 kg)   SpO2 97%   BMI 35.07 kg/m   Visual Acuity Right Eye Distance:   Left Eye Distance:   Bilateral Distance:    Right Eye Near:   Left Eye Near:    Bilateral Near:     Physical Exam Vitals and nursing note reviewed.  Constitutional:      General: She is not in acute distress.    Appearance: Normal appearance. She is well-developed. She is not ill-appearing.  HENT:     Right Ear: Tympanic membrane normal.     Left Ear: Tympanic membrane normal.     Nose: Nose normal.     Mouth/Throat:     Mouth: Mucous membranes are moist.      Pharynx: Posterior oropharyngeal erythema present.  Eyes:     Conjunctiva/sclera: Conjunctivae normal.  Cardiovascular:     Rate and Rhythm: Normal rate and regular rhythm.     Heart sounds: Normal heart sounds.  Pulmonary:     Effort: Pulmonary effort is normal. No respiratory distress.     Breath sounds: Normal breath sounds.  Musculoskeletal:     Cervical back: Neck supple.  Skin:    General: Skin is warm and dry.  Neurological:     Mental Status: She is alert.  Psychiatric:        Mood and Affect: Mood normal.        Behavior: Behavior normal.      UC Treatments / Results  Labs (all labs ordered are listed, but only abnormal results are displayed) Labs Reviewed  POCT RAPID STREP A (OFFICE) - Abnormal; Notable for the following components:      Result Value   Rapid Strep A Screen Positive (*)    All other components within normal limits    EKG   Radiology No results found.  Procedures Procedures (including critical care time)  Medications Ordered in UC Medications  penicillin g benzathine (BICILLIN LA) 1200000 UNIT/2ML injection 1.2 Million Units (1.2 Million Units Intramuscular Given 12/19/21 1156)    Initial Impression / Assessment and Plan / UC Course  I have reviewed the triage vital signs and the nursing notes.  Pertinent labs & imaging results that were available during my care of the patient were reviewed by me and considered in my medical decision making (see chart for details).   Strep pharyngitis.  Rapid strep positive.  Per patient preference, treating with Bicillin LA.  Discussed symptomatic treatment including Tylenol or ibuprofen, rest, hydration.  Instructed patient to follow up with her PCP if her symptoms are not improving.  She agrees to plan of care.  Final Clinical Impressions(s) / UC Diagnoses   Final diagnoses:  Strep pharyngitis     Discharge Instructions      You were given an injection of a long-acting penicillin today to  treat strep throat.  No other antibiotic is needed.    Take Tylenol or ibuprofen as needed for fever or discomfort.  Rest and keep yourself hydrated.    Follow-up with your primary care provider if your symptoms are not improving.         ED Prescriptions   None    PDMP not reviewed this encounter.   Mickie Bail, NP 12/19/21 [phone]

## 2022-01-19 ENCOUNTER — Encounter: Payer: Self-pay | Admitting: Family Medicine

## 2022-01-19 NOTE — Telephone Encounter (Signed)
FYI... records on ativan

## 2022-01-19 NOTE — Telephone Encounter (Signed)
Pt requesting refill of her Ativan that is PRN from last PCP

## 2022-02-19 DIAGNOSIS — Z1151 Encounter for screening for human papillomavirus (HPV): Secondary | ICD-10-CM | POA: Diagnosis not present

## 2022-02-19 DIAGNOSIS — Z01419 Encounter for gynecological examination (general) (routine) without abnormal findings: Secondary | ICD-10-CM | POA: Diagnosis not present

## 2022-02-19 DIAGNOSIS — Z113 Encounter for screening for infections with a predominantly sexual mode of transmission: Secondary | ICD-10-CM | POA: Diagnosis not present

## 2022-02-19 DIAGNOSIS — R87612 Low grade squamous intraepithelial lesion on cytologic smear of cervix (LGSIL): Secondary | ICD-10-CM | POA: Diagnosis not present

## 2022-03-12 ENCOUNTER — Encounter: Payer: Self-pay | Admitting: Family Medicine

## 2022-03-12 ENCOUNTER — Ambulatory Visit (INDEPENDENT_AMBULATORY_CARE_PROVIDER_SITE_OTHER): Payer: BC Managed Care – PPO | Admitting: Family Medicine

## 2022-03-12 VITALS — BP 118/72 | HR 82 | Temp 97.6°F | Ht 63.0 in | Wt 210.0 lb

## 2022-03-12 DIAGNOSIS — D75839 Thrombocytosis, unspecified: Secondary | ICD-10-CM

## 2022-03-12 DIAGNOSIS — Z862 Personal history of diseases of the blood and blood-forming organs and certain disorders involving the immune mechanism: Secondary | ICD-10-CM

## 2022-03-12 DIAGNOSIS — Z23 Encounter for immunization: Secondary | ICD-10-CM

## 2022-03-12 DIAGNOSIS — R5383 Other fatigue: Secondary | ICD-10-CM | POA: Diagnosis not present

## 2022-03-12 DIAGNOSIS — R87619 Unspecified abnormal cytological findings in specimens from cervix uteri: Secondary | ICD-10-CM | POA: Insufficient documentation

## 2022-03-12 DIAGNOSIS — F411 Generalized anxiety disorder: Secondary | ICD-10-CM

## 2022-03-12 DIAGNOSIS — Z9081 Acquired absence of spleen: Secondary | ICD-10-CM

## 2022-03-12 DIAGNOSIS — D509 Iron deficiency anemia, unspecified: Secondary | ICD-10-CM

## 2022-03-12 DIAGNOSIS — E559 Vitamin D deficiency, unspecified: Secondary | ICD-10-CM

## 2022-03-12 LAB — CBC WITH DIFFERENTIAL/PLATELET
Basophils Relative: 1 % (ref 0.0–3.0)
Eosinophils Relative: 5 % (ref 0.0–5.0)
HCT: 36.5 % (ref 36.0–46.0)
Hemoglobin: 11.5 g/dL — ABNORMAL LOW (ref 12.0–15.0)
Lymphocytes Relative: 46 % (ref 12.0–46.0)
MCHC: 31.5 g/dL (ref 30.0–36.0)
MCV: 69.5 fl — ABNORMAL LOW (ref 78.0–100.0)
Monocytes Relative: 5 % (ref 3.0–12.0)
Neutrophils Relative %: 443 % — ABNORMAL HIGH (ref 43.0–77.0)
Platelets: 588 10*3/uL — ABNORMAL HIGH (ref 150.0–400.0)
RBC: 5.25 Mil/uL — ABNORMAL HIGH (ref 3.87–5.11)
RDW: 18.5 % — ABNORMAL HIGH (ref 11.5–15.5)
WBC: 10.5 10*3/uL (ref 4.0–10.5)

## 2022-03-12 LAB — VITAMIN D 25 HYDROXY (VIT D DEFICIENCY, FRACTURES): VITD: 20.94 ng/mL — ABNORMAL LOW (ref 30.00–100.00)

## 2022-03-12 LAB — COMPREHENSIVE METABOLIC PANEL
ALT: 14 U/L (ref 0–35)
AST: 18 U/L (ref 0–37)
Albumin: 4.1 g/dL (ref 3.5–5.2)
Alkaline Phosphatase: 74 U/L (ref 39–117)
BUN: 12 mg/dL (ref 6–23)
CO2: 29 mEq/L (ref 19–32)
Calcium: 9.4 mg/dL (ref 8.4–10.5)
Chloride: 105 mEq/L (ref 96–112)
Creatinine, Ser: 0.68 mg/dL (ref 0.40–1.20)
GFR: 112.67 mL/min (ref >60.00)
Glucose, Bld: 80 mg/dL (ref 70–99)
Potassium: 3.8 mEq/L (ref 3.5–5.1)
Sodium: 139 mEq/L (ref 135–145)
Total Bilirubin: 0.3 mg/dL (ref 0.2–1.2)
Total Protein: 7.3 g/dL (ref 6.0–8.3)

## 2022-03-12 LAB — TSH: TSH: 1.22 u[IU]/mL (ref 0.35–5.50)

## 2022-03-12 LAB — VITAMIN B12: Vitamin B-12: 315 pg/mL (ref 211–911)

## 2022-03-12 MED ORDER — ESCITALOPRAM OXALATE 5 MG PO TABS: 5.0000 mg | ORAL_TABLET | Freq: Every day | ORAL | 1 refills | Status: DC

## 2022-03-12 NOTE — Progress Notes (Unsigned)
Subjective:     Patient ID: Regina Martin, female    DOB: 01/12/1985, 37 y.o.   MRN: [id number]  Chief Complaint  Patient presents with   Anemia    HPI Patient is in today for follow up on multiple concerns.   She saw GI and is being treated for GERD with pantoprazole   Recent OB/GYN visit.  Taking Nu-iron 150 mg bid.  Hgb was 12.0 on 12/23/2021  Not on contraception.   OCPs made migraines worse in the past.   Diet is poor. Eating late in the evening.    Health Maintenance Due  Topic Date Due   HIV Screening  Never done   Hepatitis C Screening  Never done    Past Medical History:  Diagnosis Date   Abnormal Pap smear    lsil   Anemia    Collagen vascular disease ([facility])    History of ITP    Migraine     Past Surgical History:  Procedure Laterality Date   CHOLECYSTECTOMY     spleenectomy  1992   SPLENECTOMY, PARTIAL      Family History  Problem Relation Age of Onset   Healthy Mother    Seizures Mother    Healthy Father    Colon cancer Maternal Great-grandmother    Esophageal cancer Neg Hx    Stomach cancer Neg Hx     Social History   Socioeconomic History   Marital status: Married    Spouse name: Not on file   Number of children: 2   Years of education: Not on file   Highest education level: Not on file  Occupational History   Occupation: cma  Tobacco Use   Smoking status: Never   Smokeless tobacco: Never  Vaping Use   Vaping Use: Never used  Substance and Sexual Activity   Alcohol use: Yes    Comment: occ   Drug use: Never   Sexual activity: Yes    Birth control/protection: None, Pill  Other Topics Concern   Not on file  Social History Narrative   ** Merged History Encounter **       Social Determinants of Health   Financial Resource Strain: Not on file  Food Insecurity: Not on file  Transportation Needs: Not on file  Physical Activity: Not on file  Stress: Not on file  Social Connections: Not on file  Intimate Partner  Violence: Not on file    Outpatient Medications Prior to Visit  Medication Sig Dispense Refill   Cetirizine HCl (ZYRTEC ALLERGY PO) Take by mouth.     ferrous sulfate 325 (65 FE) MG EC tablet Take 325 mg by mouth 3 (three) times daily with meals.     NON FORMULARY Hair skin and nail vitamin 2 a day     pantoprazole (PROTONIX) 40 MG tablet Take 1 tablet (40 mg total) by mouth 2 (two) times daily before a meal. 60 tablet 2   No facility-administered medications prior to visit.    Allergies  Allergen Reactions   Contrast Media [Iodinated Contrast Media] Hives    ROS     Objective:    Physical Exam  BP 118/72 (BP Location: Left Arm, Patient Position: Sitting, Cuff Size: Large)   Pulse 82   Temp 97.6 F (36.4 C) (Temporal)   Ht '5\' 3"'$  (1.6 m)   Wt 210 lb (95.3 kg)   SpO2 98%   BMI 37.20 kg/m  Wt Readings from Last 3 Encounters:  03/12/22 210 lb (95.3  kg)  12/19/21 198 lb (89.8 kg)  10/01/21 213 lb (96.6 kg)       Assessment & Plan:   Problem List Items Addressed This Visit       Hematopoietic and Hemostatic   Thrombocytosis     Other   Fatigue   Relevant Orders   Iron, TIBC and Ferritin Panel   TSH   VITAMIN D 25 Hydroxy (Vit-D Deficiency, Fractures)   Vitamin B12   GAD (generalized anxiety disorder)   Relevant Medications   escitalopram (LEXAPRO) 5 MG tablet   History of ITP   Relevant Orders   CBC with Differential/Platelet   History of splenectomy   Iron deficiency anemia - Primary   Relevant Orders   Iron, TIBC and Ferritin Panel   CBC with Differential/Platelet   Comprehensive metabolic panel   Vitamin D deficiency   Relevant Orders   VITAMIN D 25 Hydroxy (Vit-D Deficiency, Fractures)    I am having Regina Martin start on escitalopram. I am also having her maintain her Cetirizine HCl (ZYRTEC ALLERGY PO), ferrous sulfate, NON FORMULARY, and pantoprazole.  Meds ordered this encounter  Medications   escitalopram (LEXAPRO) 5 MG tablet     Sig: Take 1 tablet (5 mg total) by mouth daily.    Dispense:  30 tablet    Refill:  1    Order Specific Question:   Supervising Provider    Answer:   Pricilla Holm A [id number]

## 2022-03-13 LAB — IRON,TIBC AND FERRITIN PANEL
%SAT: 9 % (calc) — ABNORMAL LOW (ref 16–45)
Ferritin: 5 ng/mL — ABNORMAL LOW (ref 16–154)
Iron: 38 ug/dL — ABNORMAL LOW (ref 40–190)
TIBC: 401 mcg/dL (calc) (ref 250–450)

## 2022-03-14 ENCOUNTER — Other Ambulatory Visit: Payer: Self-pay | Admitting: Family Medicine

## 2022-03-14 DIAGNOSIS — Z9081 Acquired absence of spleen: Secondary | ICD-10-CM

## 2022-03-14 DIAGNOSIS — D508 Other iron deficiency anemias: Secondary | ICD-10-CM

## 2022-03-14 DIAGNOSIS — D75839 Thrombocytosis, unspecified: Secondary | ICD-10-CM

## 2022-03-14 DIAGNOSIS — E559 Vitamin D deficiency, unspecified: Secondary | ICD-10-CM

## 2022-03-14 DIAGNOSIS — D729 Disorder of white blood cells, unspecified: Secondary | ICD-10-CM | POA: Insufficient documentation

## 2022-03-14 MED ORDER — VITAMIN D (ERGOCALCIFEROL) 1.25 MG (50000 UNIT) PO CAPS: 50000.0000 [IU] | ORAL_CAPSULE | ORAL | 0 refills | Status: DC

## 2022-03-15 ENCOUNTER — Telehealth: Payer: Self-pay | Admitting: Internal Medicine

## 2022-03-15 NOTE — Telephone Encounter (Signed)
scheduled per 3/8 referral , pt has been called and confirmed date and time. Pt is aware of location and to arrive early for check in

## 2022-04-05 ENCOUNTER — Encounter: Payer: Self-pay | Admitting: Internal Medicine

## 2022-04-05 ENCOUNTER — Other Ambulatory Visit: Payer: Self-pay

## 2022-04-05 ENCOUNTER — Other Ambulatory Visit: Payer: Self-pay | Admitting: Internal Medicine

## 2022-04-05 ENCOUNTER — Inpatient Hospital Stay: Payer: BC Managed Care – PPO

## 2022-04-05 ENCOUNTER — Inpatient Hospital Stay: Payer: BC Managed Care – PPO | Attending: Internal Medicine | Admitting: Internal Medicine

## 2022-04-05 VITALS — BP 119/79 | HR 65 | Temp 98.2°F | Resp 16 | Ht 63.0 in | Wt 207.5 lb

## 2022-04-05 DIAGNOSIS — D500 Iron deficiency anemia secondary to blood loss (chronic): Secondary | ICD-10-CM

## 2022-04-05 DIAGNOSIS — D509 Iron deficiency anemia, unspecified: Secondary | ICD-10-CM | POA: Diagnosis not present

## 2022-04-05 DIAGNOSIS — D563 Thalassemia minor: Secondary | ICD-10-CM | POA: Diagnosis not present

## 2022-04-05 DIAGNOSIS — D75838 Other thrombocytosis: Secondary | ICD-10-CM | POA: Insufficient documentation

## 2022-04-05 DIAGNOSIS — Z800 Family history of malignant neoplasm of digestive organs: Secondary | ICD-10-CM | POA: Diagnosis not present

## 2022-04-05 DIAGNOSIS — D508 Other iron deficiency anemias: Secondary | ICD-10-CM

## 2022-04-05 DIAGNOSIS — Z79899 Other long term (current) drug therapy: Secondary | ICD-10-CM | POA: Insufficient documentation

## 2022-04-05 DIAGNOSIS — Z9081 Acquired absence of spleen: Secondary | ICD-10-CM

## 2022-04-05 LAB — CBC WITH DIFFERENTIAL (CANCER CENTER ONLY)
Abs Immature Granulocytes: 0.01 10*3/uL (ref 0.00–0.07)
Basophils Absolute: 0.1 10*3/uL (ref 0.0–0.1)
Basophils Relative: 1 %
Eosinophils Absolute: 0.3 10*3/uL (ref 0.0–0.5)
Eosinophils Relative: 4 %
HCT: 33.6 % — ABNORMAL LOW (ref 36.0–46.0)
Hemoglobin: 11.4 g/dL — ABNORMAL LOW (ref 12.0–15.0)
Immature Granulocytes: 0 %
Lymphocytes Relative: 44 %
Lymphs Abs: 3.3 10*3/uL (ref 0.7–4.0)
MCH: 22.1 pg — ABNORMAL LOW (ref 26.0–34.0)
MCHC: 33.9 g/dL (ref 30.0–36.0)
MCV: 65 fL — ABNORMAL LOW (ref 80.0–100.0)
Monocytes Absolute: 0.5 10*3/uL (ref 0.1–1.0)
Monocytes Relative: 8 %
Neutro Abs: 3.1 10*3/uL (ref 1.7–7.7)
Neutrophils Relative %: 43 %
Platelet Count: 516 10*3/uL — ABNORMAL HIGH (ref 150–400)
RBC: 5.17 MIL/uL — ABNORMAL HIGH (ref 3.87–5.11)
RDW: 18.5 % — ABNORMAL HIGH (ref 11.5–15.5)
WBC Count: 7.2 10*3/uL (ref 4.0–10.5)
nRBC: 0 % (ref 0.0–0.2)

## 2022-04-05 LAB — CMP (CANCER CENTER ONLY)
ALT: 12 U/L (ref 0–44)
AST: 16 U/L (ref 15–41)
Albumin: 4.2 g/dL (ref 3.5–5.0)
Alkaline Phosphatase: 75 U/L (ref 38–126)
Anion gap: 6 (ref 5–15)
BUN: 12 mg/dL (ref 6–20)
CO2: 27 mmol/L (ref 22–32)
Calcium: 9.6 mg/dL (ref 8.9–10.3)
Chloride: 107 mmol/L (ref 98–111)
Creatinine: 0.84 mg/dL (ref 0.44–1.00)
GFR, Estimated: >60 mL/min (ref >60)
Glucose, Bld: 95 mg/dL (ref 70–99)
Potassium: 4 mmol/L (ref 3.5–5.1)
Sodium: 140 mmol/L (ref 135–145)
Total Bilirubin: 0.4 mg/dL (ref 0.3–1.2)
Total Protein: 7.5 g/dL (ref 6.5–8.1)

## 2022-04-05 LAB — FERRITIN: Ferritin: 4 ng/mL — ABNORMAL LOW (ref 11–307)

## 2022-04-05 LAB — IRON AND IRON BINDING CAPACITY (CC-WL,HP ONLY)
Iron: 28 ug/dL (ref 28–170)
Saturation Ratios: 6 % — ABNORMAL LOW (ref 10.4–31.8)
TIBC: 442 ug/dL (ref 250–450)
UIBC: 414 ug/dL (ref 148–442)

## 2022-04-05 LAB — FOLATE: Folate: 6.9 ng/mL (ref >5.9)

## 2022-04-05 NOTE — Progress Notes (Signed)
[facility] [phone]   [phone]  CONSULT NOTE  REFERRING PHYSICIAN: Dr. Harland Dingwall NP  REASON FOR CONSULTATION:  37 years old African-American female with anemia and thrombocytosis.  HPI Regina Martin is a 37 y.o. female with past medical history significant for anxiety, vitamin D deficiency as well as collagen vascular disease and migraine headache.  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  The patient also has a history of thalassemia minor.  The patient was seen by her primary care provider for routine evaluation.  She had repeat CBC on March 12, 2022 that showed hemoglobin of 11.5 and hematocrit 36.5% with MCV of 69.5 and elevated platelet count of 588,000.  She had iron study performed at that time that showed low serum iron of 38 with iron saturation of 9% and ferritin level of 5.  She was referred to me today for evaluation and recommendation regarding her condition. When seen today she is feeling fine with no concerning complaints except for fatigue and shortness of breath with exertion and occasional dizzy spells.  She is currently on ferrous sulfate 325 mg p.o. twice daily with orange juice and tolerating it well except for upset stomach and constipation.  She started treatment with Lysteda by her gynecologist recently because of the heavy menstrual cycles and this is improved her condition. She denied having any current chest pain, cough or hemoptysis.  She has no nausea, vomiting, abdominal pain or diarrhea.  She has no recent weight loss or night sweats.  She has no headache or visual changes. Family history significant for mother with seizure.  Father is healthy.  Maternal grandmother had colon cancer at age 33 and sister had pulmonary embolism after pregnancy. The patient is married and has 2 children age 60 and 60.  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   She has no history for smoking, alcohol or drug abuse.  HPI  Past Medical History:  Diagnosis Date   Abnormal Pap smear    lsil   Anemia    Collagen vascular disease ([place])    History of ITP    Migraine     Past Surgical History:  Procedure Laterality Date   CHOLECYSTECTOMY     spleenectomy  1992   SPLENECTOMY, PARTIAL      Family History  Problem Relation Age of Onset   Healthy Mother    Seizures Mother    Healthy Father    Colon cancer Maternal Great-grandmother    Esophageal cancer Neg Hx    Stomach cancer Neg Hx     Social History Social History   Tobacco Use   Smoking status: Never   Smokeless tobacco: Never  Vaping Use   Vaping Use: Never used  Substance Use Topics   Alcohol use: Yes    Comment: occ   Drug use: Never    Allergies  Allergen Reactions   Contrast Media [Iodinated Contrast Media] Hives    Current Outpatient Medications  Medication Sig Dispense Refill   Cetirizine HCl (ZYRTEC ALLERGY PO) Take by mouth.     escitalopram (LEXAPRO) 5 MG tablet Take 1 tablet (5 mg total) by mouth daily. 30 tablet 1   ferrous sulfate 325 (65 FE) MG EC tablet Take 325 mg by mouth 3 (three) times daily with meals.     NON FORMULARY Hair skin and nail vitamin 2 a day  pantoprazole (PROTONIX) 40 MG tablet Take 1 tablet (40 mg total) by mouth 2 (two) times daily before a meal. 60 tablet 2   Vitamin D, Ergocalciferol, (DRISDOL) 1.25 MG (50000 UNIT) CAPS capsule Take 1 capsule (50,000 Units total) by mouth every 7 (seven) days. 8 capsule 0   No current facility-administered medications for this visit.    Review of Systems  Constitutional: positive for fatigue Eyes: negative Ears, nose, mouth, throat, and face: negative Respiratory: positive for dyspnea on exertion Cardiovascular: negative Gastrointestinal: negative Genitourinary:negative Integument/breast: negative Hematologic/lymphatic: negative Musculoskeletal:negative Neurological: positive for  dizziness Behavioral/Psych: negative Endocrine: negative Allergic/Immunologic: negative  Physical Exam  [id number], healthy, no distress, well nourished, and well developed SKIN: skin color, texture, turgor are normal, no rashes or significant lesions HEAD: Normocephalic, No masses, lesions, tenderness or abnormalities EYES: normal, PERRLA, Conjunctiva are pink and non-injected EARS: External ears normal, Canals clear OROPHARYNX:no exudate, no erythema, and lips, buccal mucosa, and tongue normal  NECK: supple, no adenopathy, no JVD LYMPH:  no palpable lymphadenopathy, no hepatosplenomegaly BREAST:not examined LUNGS: clear to auscultation , and palpation HEART: regular rate & rhythm, no murmurs, and no gallops ABDOMEN:abdomen soft, non-tender, normal bowel sounds, and no masses or organomegaly BACK: Back symmetric, no curvature., No CVA tenderness EXTREMITIES:no joint deformities, effusion, or inflammation, no edema  NEURO: alert & oriented x 3 with fluent speech, no focal motor/sensory deficits  PERFORMANCE STATUS: ECOG 1  LABORATORY DATA: Lab Results  Component Value Date   WBC 10.5 03/12/2022   HGB 11.5 (L) 03/12/2022   HCT 36.5 03/12/2022   MCV 69.5 Repeated and verified X2. (L) 03/12/2022   PLT 588.0 (H) 03/12/2022      Chemistry      Component Value Date/Time   NA 139 03/12/2022 1418   K 3.8 03/12/2022 1418   CL 105 03/12/2022 1418   CO2 29 03/12/2022 1418   BUN 12 03/12/2022 1418   CREATININE 0.68 03/12/2022 1418   CREATININE 0.76 11/15/2019 1336      Component Value Date/Time   CALCIUM 9.4 03/12/2022 1418   ALKPHOS 74 03/12/2022 1418   AST 18 03/12/2022 1418   ALT 14 03/12/2022 1418   BILITOT 0.3 03/12/2022 1418       RADIOGRAPHIC STUDIES: No results found.  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   PLAN: I had a lengthy discussion with the patient today about her condition and treatment options. I ordered several studies today including repeat CBC that showed hemoglobin of 11.4 and hematocrit 33.6 with MCV of 65%.  Her platelets count are elevated at 516,000.  Iron studies showed low normal serum iron of 28 with iron saturation of 6%.  Ferritin level is still pending. She is currently on oral iron tablet but not effective and she also has upset stomach with the oral drug. I recommended for the patient proceed with iron infusion with Venofer 300 Mg IV weekly for 3 weeks at the [facility]. I will see her back for follow-up visit in 3 months for evaluation and repeat blood work. She was advised to call immediately if she has any other concerning symptoms in the interval. The patient voices understanding of current disease status and treatment options and is in agreement with the current care plan.  All questions were answered. The patient knows to call the clinic with any problems,  questions or concerns. We can certainly see the patient much sooner if necessary.  Thank you so much for allowing me to participate in the care of Radiance Ho. I will continue to follow up the patient with you and assist in her care.  The total time spent in the appointment was 60 minutes.  Disclaimer: This note was dictated with voice recognition software. Similar sounding words can inadvertently be transcribed and may not be corrected upon review.   Eilleen Kempf April 05, 2022, 11:47 AM

## 2022-04-06 ENCOUNTER — Telehealth: Payer: Self-pay | Admitting: Internal Medicine

## 2022-04-06 NOTE — Telephone Encounter (Signed)
Scheduled per 04/01 los, patient has been called and voicemail was left. 

## 2022-04-08 ENCOUNTER — Encounter: Payer: Self-pay | Admitting: Internal Medicine

## 2022-04-08 DIAGNOSIS — M25571 Pain in right ankle and joints of right foot: Secondary | ICD-10-CM | POA: Diagnosis not present

## 2022-04-09 ENCOUNTER — Other Ambulatory Visit: Payer: Self-pay

## 2022-04-09 ENCOUNTER — Other Ambulatory Visit: Payer: Self-pay | Admitting: Physician Assistant

## 2022-04-12 ENCOUNTER — Telehealth: Payer: Self-pay

## 2022-04-12 NOTE — Telephone Encounter (Signed)
Auth Submission: NO AUTH NEEDED Site of care: Site of care: [facility] Payer: [employer] Medication & CPT/J Code(s) submitted: Venofer (Iron Sucrose) J1756 Route of submission (phone, fax, portal):   Phone # Fax # Auth type: Buy/Bill Units/visits requested: 3 Reference number:  Approval from: 04/12/2022 to 08/12/2022

## 2022-04-14 ENCOUNTER — Telehealth: Payer: Self-pay | Admitting: Pharmacy Technician

## 2022-04-14 ENCOUNTER — Encounter: Payer: Self-pay | Admitting: Family Medicine

## 2022-04-14 NOTE — Telephone Encounter (Signed)
Colman Cater note:  Patient will be scheduled as soon as possible.  Auth Submission: NO AUTH NEEDED Site of care: Site of care: [facility] Payer: [employer] Medication & CPT/J Code(s) submitted: Venofer (Iron Sucrose) J1756 Route of submission (phone, fax, portal):  Phone # Fax # Auth type: Buy/Bill Units/visits requested: 3 Reference number:  Approval from: 04/14/22 to 08/14/22

## 2022-04-16 ENCOUNTER — Encounter: Payer: Self-pay | Admitting: Family Medicine

## 2022-04-16 ENCOUNTER — Telehealth (INDEPENDENT_AMBULATORY_CARE_PROVIDER_SITE_OTHER): Payer: BC Managed Care – PPO | Admitting: Family Medicine

## 2022-04-16 DIAGNOSIS — F411 Generalized anxiety disorder: Secondary | ICD-10-CM | POA: Diagnosis not present

## 2022-04-16 NOTE — Progress Notes (Signed)
MyChart Video Visit    Virtual Visit via Video Note    Patient location: Home. Patient and provider in visit Provider location: Office  I discussed the limitations of evaluation and management by telemedicine and the availability of in person appointments. The patient expressed understanding and agreed to proceed.  Visit Date: 04/16/2022  Today's healthcare provider: Hetty Blend, NP-C     Subjective:    Patient ID: Regina Martin, female    DOB: 07-06-1985, 37 y.o.   MRN: [id number]  Chief Complaint  Patient presents with   Anxiety    F/u on lexapro    HPI  This is a f/u on anxiety. Started Lexapro 4 wks ago. Feels much better. No side effects.  Seeing hematologist for IDA. Getting iron infusion soon.   No new concerns.   No fever,chills, N/V/D. No SI   Past Medical History:  Diagnosis Date   Abnormal Pap smear    lsil   Anemia    Collagen vascular disease    History of ITP    Migraine     Past Surgical History:  Procedure Laterality Date   CHOLECYSTECTOMY     spleenectomy  1992   SPLENECTOMY, PARTIAL      Family History  Problem Relation Age of Onset   Healthy Mother    Seizures Mother    Healthy Father    Colon cancer Maternal Great-grandmother    Esophageal cancer Neg Hx    Stomach cancer Neg Hx     Social History   Socioeconomic History   Marital status: Married    Spouse name: Not on file   Number of children: 2   Years of education: Not on file   Highest education level: Not on file  Occupational History   Occupation: cma  Tobacco Use   Smoking status: Never   Smokeless tobacco: Never  Vaping Use   Vaping Use: Never used  Substance and Sexual Activity   Alcohol use: Yes    Comment: occ   Drug use: Never   Sexual activity: Yes    Birth control/protection: None, Pill  Other Topics Concern   Not on file  Social History Narrative   ** Merged History Encounter **       Social Determinants of Health   Financial  Resource Strain: Not on file  Food Insecurity: Not on file  Transportation Needs: Not on file  Physical Activity: Not on file  Stress: Not on file  Social Connections: Not on file  Intimate Partner Violence: Not on file    Outpatient Medications Prior to Visit  Medication Sig Dispense Refill   Cetirizine HCl (ZYRTEC ALLERGY PO) Take by mouth.     escitalopram (LEXAPRO) 5 MG tablet Take 1 tablet (5 mg total) by mouth daily. 30 tablet 1   ferrous sulfate 325 (65 FE) MG EC tablet Take 325 mg by mouth 3 (three) times daily with meals.     ketoconazole (NIZORAL) 2 % shampoo Apply 1 Application topically as directed. Apply 1 tsp over port site at lease one hour prior to port access and cover with plastic wrap such as cling wrap..     NON FORMULARY Hair skin and nail vitamin 2 a day     pantoprazole (PROTONIX) 40 MG tablet Take 1 tablet (40 mg total) by mouth 2 (two) times daily before a meal. 60 tablet 2   tranexamic acid (LYSTEDA) 650 MG TABS tablet Take 1,300 mg by mouth 3 (three) times daily. Take  the first 5 days of your menstrual cycle     Vitamin D, Ergocalciferol, (DRISDOL) 1.25 MG (50000 UNIT) CAPS capsule Take 1 capsule (50,000 Units total) by mouth every 7 (seven) days. 8 capsule 0   No facility-administered medications prior to visit.    Allergies  Allergen Reactions   Contrast Media [Iodinated Contrast Media] Hives    ROS     Objective:    Physical Exam  There were no vitals taken for this visit. Wt Readings from Last 3 Encounters:  04/05/22 207 lb 8 oz (94.1 kg)  03/12/22 210 lb (95.3 kg)  12/19/21 198 lb (89.8 kg)   Alert and oriented and in no acute distress.  Respirations unlabored.  Speaking in complete sentences without difficulty.  Normal speech.  Smiling and her mood is good.    Assessment & Plan:   Problem List Items Addressed This Visit       Other   GAD (generalized anxiety disorder) - Primary   She is doing well on Lexapro.  Continue medication at  current dose.  Follow-up in 3 months or sooner if needed.  I am having Cadynce Cullers maintain her Cetirizine HCl (ZYRTEC ALLERGY PO), ferrous sulfate, NON FORMULARY, pantoprazole, escitalopram, Vitamin D (Ergocalciferol), tranexamic acid, and ketoconazole.  No orders of the defined types were placed in this encounter.   I discussed the assessment and treatment plan with the patient. The patient was provided an opportunity to ask questions and all were answered. The patient agreed with the plan and demonstrated an understanding of the instructions.   The patient was advised to call back or seek an in-person evaluation if the symptoms worsen or if the condition fails to improve as anticipated.  I provided 8 minutes of face-to-face time during this encounter.   Hetty Blend, NP-C [facility] [place] [phone] (phone) [phone] (fax)  [facility]

## 2022-04-20 ENCOUNTER — Ambulatory Visit: Payer: BC Managed Care – PPO

## 2022-04-21 ENCOUNTER — Ambulatory Visit (INDEPENDENT_AMBULATORY_CARE_PROVIDER_SITE_OTHER): Payer: BC Managed Care – PPO

## 2022-04-21 VITALS — BP 112/71 | HR 60 | Temp 97.8°F | Resp 18 | Ht 63.0 in | Wt 210.4 lb

## 2022-04-21 DIAGNOSIS — D509 Iron deficiency anemia, unspecified: Secondary | ICD-10-CM | POA: Diagnosis not present

## 2022-04-21 DIAGNOSIS — D500 Iron deficiency anemia secondary to blood loss (chronic): Secondary | ICD-10-CM

## 2022-04-21 MED ORDER — DIPHENHYDRAMINE HCL 25 MG PO CAPS
25.0000 mg | ORAL_CAPSULE | Freq: Once | ORAL | Status: AC
  Administered 2022-04-21: 25 mg via ORAL
  Filled 2022-04-21: qty 1

## 2022-04-21 MED ORDER — ACETAMINOPHEN 325 MG PO TABS
650.0000 mg | ORAL_TABLET | Freq: Once | ORAL | Status: AC
  Administered 2022-04-21: 650 mg via ORAL
  Filled 2022-04-21: qty 2

## 2022-04-21 MED ORDER — SODIUM CHLORIDE 0.9 % IV SOLN
300.0000 mg | Freq: Once | INTRAVENOUS | Status: AC
  Administered 2022-04-21: 300 mg via INTRAVENOUS
  Filled 2022-04-21: qty 15

## 2022-04-21 NOTE — Patient Instructions (Signed)

## 2022-04-21 NOTE — Progress Notes (Signed)
Diagnosis: Iron Deficiency Anemia  Provider:  Chilton Greathouse MD  Procedure: Infusion  IV Type: Peripheral, IV Location: L Antecubital  Venofer (Iron Sucrose), Dose: 300 mg  Infusion Start Time: 1342  Infusion Stop Time: 1518  Post Infusion IV Care: Observation period completed and Peripheral IV Discontinued  Discharge: Condition: Good, Destination: Home . AVS Declined  Performed by:  Adriana Mccallum, RN

## 2022-04-28 ENCOUNTER — Ambulatory Visit (INDEPENDENT_AMBULATORY_CARE_PROVIDER_SITE_OTHER): Payer: BC Managed Care – PPO

## 2022-04-28 VITALS — BP 119/72 | HR 73 | Temp 97.4°F | Resp 18 | Ht 63.0 in | Wt 212.2 lb

## 2022-04-28 DIAGNOSIS — D509 Iron deficiency anemia, unspecified: Secondary | ICD-10-CM

## 2022-04-28 DIAGNOSIS — D500 Iron deficiency anemia secondary to blood loss (chronic): Secondary | ICD-10-CM

## 2022-04-28 MED ORDER — SODIUM CHLORIDE 0.9 % IV SOLN
300.0000 mg | Freq: Once | INTRAVENOUS | Status: AC
  Administered 2022-04-28: 300 mg via INTRAVENOUS
  Filled 2022-04-28: qty 15

## 2022-04-28 MED ORDER — ACETAMINOPHEN 325 MG PO TABS: 650.0000 mg | ORAL_TABLET | Freq: Once | ORAL | Status: DC

## 2022-04-28 MED ORDER — DIPHENHYDRAMINE HCL 25 MG PO CAPS: 25.0000 mg | ORAL_CAPSULE | Freq: Once | ORAL | Status: DC

## 2022-04-28 NOTE — Progress Notes (Signed)
Diagnosis: Iron Deficiency Anemia  Provider:  Chilton Greathouse MD  Procedure: IV Infusion  IV Type: Peripheral, IV Location: L Antecubital  Venofer (Iron Sucrose), Dose: 200 mg  Infusion Start Time: 1419  Infusion Stop Time: 1559  Post Infusion IV Care: Peripheral IV Discontinued  Discharge: Condition: Good, Destination: Home . AVS Declined  Performed by:  Adriana Mccallum, RN

## 2022-04-30 ENCOUNTER — Other Ambulatory Visit: Payer: Self-pay

## 2022-05-04 ENCOUNTER — Other Ambulatory Visit: Payer: Self-pay | Admitting: Gastroenterology

## 2022-05-05 ENCOUNTER — Ambulatory Visit (INDEPENDENT_AMBULATORY_CARE_PROVIDER_SITE_OTHER): Payer: BC Managed Care – PPO

## 2022-05-05 VITALS — BP 102/72 | HR 67 | Temp 97.6°F | Resp 16 | Ht 63.0 in | Wt 205.8 lb

## 2022-05-05 DIAGNOSIS — D500 Iron deficiency anemia secondary to blood loss (chronic): Secondary | ICD-10-CM

## 2022-05-05 DIAGNOSIS — D509 Iron deficiency anemia, unspecified: Secondary | ICD-10-CM | POA: Diagnosis not present

## 2022-05-05 MED ORDER — ACETAMINOPHEN 325 MG PO TABS: 650.0000 mg | ORAL_TABLET | Freq: Once | ORAL | Status: DC

## 2022-05-05 MED ORDER — DIPHENHYDRAMINE HCL 25 MG PO CAPS: 25.0000 mg | ORAL_CAPSULE | Freq: Once | ORAL | Status: DC

## 2022-05-05 MED ORDER — SODIUM CHLORIDE 0.9 % IV SOLN
300.0000 mg | Freq: Once | INTRAVENOUS | Status: AC
  Administered 2022-05-05: 300 mg via INTRAVENOUS
  Filled 2022-05-05: qty 15

## 2022-05-05 NOTE — Progress Notes (Signed)
Diagnosis: Iron Deficiency Anemia  Provider:  Chilton Greathouse MD  Procedure: IV Infusion  IV Type: Peripheral, IV Location: L Antecubital  Venofer (Iron Sucrose), Dose: 300 mg  Infusion Start Time: 0936  Infusion Stop Time: 1112  Post Infusion IV Care: Peripheral IV Discontinued  Discharge: Condition: Good, Destination: Home . AVS Declined  Performed by:  Garnette Czech, RN

## 2022-05-17 ENCOUNTER — Other Ambulatory Visit: Payer: Self-pay | Admitting: Family Medicine

## 2022-05-17 NOTE — Telephone Encounter (Signed)
LVV: 04/16/22 Last fill: 03/12/22, 30 tabs 1 refill

## 2022-06-25 ENCOUNTER — Telehealth: Payer: Self-pay | Admitting: Internal Medicine

## 2022-06-25 NOTE — Telephone Encounter (Signed)
Called patient regarding July appointments, p[patient is notified.

## 2022-07-05 ENCOUNTER — Encounter: Payer: Self-pay | Admitting: Family Medicine

## 2022-07-05 MED ORDER — ESCITALOPRAM OXALATE 5 MG PO TABS: 5.0000 mg | ORAL_TABLET | Freq: Every day | ORAL | 0 refills | Status: DC

## 2022-07-05 NOTE — Telephone Encounter (Signed)
Pt requesting refills as they have expired per pharmacy

## 2022-07-06 ENCOUNTER — Other Ambulatory Visit: Payer: BC Managed Care – PPO

## 2022-07-06 ENCOUNTER — Ambulatory Visit: Payer: BC Managed Care – PPO | Admitting: Internal Medicine

## 2022-07-06 MED ORDER — ESCITALOPRAM OXALATE 5 MG PO TABS: 5.0000 mg | ORAL_TABLET | Freq: Every day | ORAL | 0 refills | Status: DC

## 2022-07-06 NOTE — Addendum Note (Signed)
Addended by: Marinus Maw on: 07/06/2022 07:39 AM   Modules accepted: Orders

## 2022-07-12 ENCOUNTER — Ambulatory Visit: Payer: BC Managed Care – PPO | Admitting: Internal Medicine

## 2022-07-12 ENCOUNTER — Other Ambulatory Visit: Payer: BC Managed Care – PPO

## 2022-07-14 ENCOUNTER — Encounter: Payer: Self-pay | Admitting: Family Medicine

## 2022-07-14 ENCOUNTER — Ambulatory Visit: Payer: BC Managed Care – PPO | Admitting: Family Medicine

## 2022-07-14 VITALS — BP 114/80 | HR 69 | Temp 97.9°F | Ht 63.0 in | Wt 211.0 lb

## 2022-07-14 DIAGNOSIS — E669 Obesity, unspecified: Secondary | ICD-10-CM | POA: Diagnosis not present

## 2022-07-14 DIAGNOSIS — D500 Iron deficiency anemia secondary to blood loss (chronic): Secondary | ICD-10-CM

## 2022-07-14 DIAGNOSIS — E559 Vitamin D deficiency, unspecified: Secondary | ICD-10-CM | POA: Diagnosis not present

## 2022-07-14 NOTE — Patient Instructions (Signed)
Continue your current medications.  I have referred you to [facility] and they will call you to schedule a visit.

## 2022-07-14 NOTE — Progress Notes (Signed)
Subjective:     Patient ID: Regina Martin, female    DOB: July 23, 1985, 37 y.o.   MRN: [id number]  Chief Complaint  Patient presents with   Follow-up    Pt states that her weigh has been up and down     HPI  Discussed the use of AI scribe software for clinical note transcription with the patient, who gave verbal consent to proceed.  History of Present Illness         C/o weight concern. Steady weight gain over the past 3 years since having her gallbladder out.   She tried Ozempic in the past and had a lot of nausea and bloating.   Taking vitamin D supplement.   Mood is better on Lexapro.   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     Health Maintenance Due  Topic Date Due   HIV Screening  Never done   Hepatitis C Screening  Never done    Past Medical History:  Diagnosis Date   Abnormal Pap smear    lsil   Anemia    Collagen vascular disease (HCC)    History of ITP    Migraine     Past Surgical History:  Procedure Laterality Date   CHOLECYSTECTOMY     spleenectomy  1992   SPLENECTOMY, PARTIAL      Family History  Problem Relation Age of Onset   Healthy Mother    Seizures Mother    Healthy Father    Colon cancer Maternal Great-grandmother    Esophageal cancer Neg Hx    Stomach cancer Neg Hx     Social History   Socioeconomic History   Marital status: Married    Spouse name: Not on file   Number of children: 2   Years of education: Not on file   Highest education level: Not on file  Occupational History   Occupation: cma  Tobacco Use   Smoking status: Never   Smokeless tobacco: Never  Vaping Use   Vaping Use: Never used  Substance and Sexual Activity   Alcohol use: Yes    Comment: occ   Drug use: Never   Sexual activity: Yes    Birth control/protection: None, Pill  Other Topics Concern   Not on file  Social History Narrative   ** Merged History Encounter **       Social Determinants of Health    Financial Resource Strain: Not on file  Food Insecurity: Not on file  Transportation Needs: Not on file  Physical Activity: Not on file  Stress: Not on file  Social Connections: Not on file  Intimate Partner Violence: Not on file    Outpatient Medications Prior to Visit  Medication Sig Dispense Refill   Cetirizine HCl (ZYRTEC ALLERGY PO) Take by mouth.     escitalopram (LEXAPRO) 5 MG tablet Take 1 tablet (5 mg total) by mouth daily. Follow-up appt is due must see Aurel Nguyen for future refills 30 tablet 0   ferrous sulfate 325 (65 FE) MG EC tablet Take 325 mg by mouth 3 (three) times daily with meals.     ketoconazole (NIZORAL) 2 % shampoo Apply 1 Application topically as directed. Apply 1 tsp over port site at lease one hour prior to port access and cover with plastic wrap such as cling wrap..     NON FORMULARY Hair skin and nail vitamin 2 a day     pantoprazole (PROTONIX) 40 MG tablet TAKE 1 TABLET (40  MG TOTAL) BY MOUTH TWICE A DAY BEFORE MEALS 60 tablet 2   tranexamic acid (LYSTEDA) 650 MG TABS tablet Take 1,300 mg by mouth 3 (three) times daily. Take the first 5 days of your menstrual cycle     Vitamin D, Ergocalciferol, (DRISDOL) 1.25 MG (50000 UNIT) CAPS capsule Take 1 capsule (50,000 Units total) by mouth every 7 (seven) days. 8 capsule 0   No facility-administered medications prior to visit.    Allergies  Allergen Reactions   Contrast Media [Iodinated Contrast Media] Hives    Review of Systems  Constitutional:  Negative for chills, fever and malaise/fatigue.  Respiratory:  Negative for shortness of breath.   Cardiovascular:  Negative for chest pain, palpitations and leg swelling.  Gastrointestinal:  Positive for abdominal pain. Negative for constipation, diarrhea, nausea and vomiting.       Bloating  Genitourinary:  Negative for dysuria, frequency and urgency.  Neurological:  Negative for dizziness, focal weakness and headaches.  Psychiatric/Behavioral:  Negative for  depression. The patient is not nervous/anxious.        Objective:    Physical Exam Constitutional:      General: She is not in acute distress.    Appearance: She is not ill-appearing.  Eyes:     Extraocular Movements: Extraocular movements intact.     Conjunctiva/sclera: Conjunctivae normal.  Cardiovascular:     Rate and Rhythm: Normal rate.  Pulmonary:     Effort: Pulmonary effort is normal.  Musculoskeletal:     Cervical back: Normal range of motion.  Skin:    General: Skin is warm and dry.  Neurological:     General: No focal deficit present.     Mental Status: She is alert and oriented to person, place, and time.  Psychiatric:        Mood and Affect: Mood normal.        Behavior: Behavior normal.        Thought Content: Thought content normal.      BP 114/80 (BP Location: Left Arm, Patient Position: Sitting, Cuff Size: Normal)   Pulse 69   Temp 97.9 F (36.6 C) (Oral)   Ht 5\' 3"  (1.6 m)   Wt 211 lb (95.7 kg)   LMP 06/22/2022   SpO2 99%   BMI 37.38 kg/m  Wt Readings from Last 3 Encounters:  07/14/22 211 lb (95.7 kg)  05/05/22 205 lb 12.8 oz (93.4 kg)  04/28/22 212 lb 3.2 oz (96.3 kg)        Assessment & Plan:   Problem List Items Addressed This Visit       Other   Iron deficiency anemia   Obesity (BMI 30-39.9) - Primary   Relevant Orders   Amb Ref to [facility]   Vitamin D deficiency   Does not appear to be a good candidate for GLP-1 since she has ongoing abdominal bloating. Follow up with GI.  Referral to [facility] Continue vitamin D supplement   I am having Martesha Broad maintain her Cetirizine HCl (ZYRTEC ALLERGY PO), ferrous sulfate, NON FORMULARY, Vitamin D (Ergocalciferol), tranexamic acid, ketoconazole, pantoprazole, and escitalopram.  No orders of the defined types were placed in this encounter.

## 2022-07-19 ENCOUNTER — Other Ambulatory Visit: Payer: Self-pay

## 2022-07-19 ENCOUNTER — Inpatient Hospital Stay: Payer: BC Managed Care – PPO | Admitting: Internal Medicine

## 2022-07-19 ENCOUNTER — Inpatient Hospital Stay: Payer: BC Managed Care – PPO | Attending: Internal Medicine

## 2022-07-19 VITALS — BP 109/69 | HR 66 | Temp 98.2°F | Resp 18 | Ht 63.0 in | Wt 209.4 lb

## 2022-07-19 DIAGNOSIS — D509 Iron deficiency anemia, unspecified: Secondary | ICD-10-CM | POA: Insufficient documentation

## 2022-07-19 DIAGNOSIS — D563 Thalassemia minor: Secondary | ICD-10-CM | POA: Diagnosis not present

## 2022-07-19 DIAGNOSIS — D500 Iron deficiency anemia secondary to blood loss (chronic): Secondary | ICD-10-CM | POA: Diagnosis not present

## 2022-07-19 DIAGNOSIS — D75838 Other thrombocytosis: Secondary | ICD-10-CM | POA: Diagnosis not present

## 2022-07-19 DIAGNOSIS — D508 Other iron deficiency anemias: Secondary | ICD-10-CM

## 2022-07-19 LAB — CBC WITH DIFFERENTIAL (CANCER CENTER ONLY)
Abs Immature Granulocytes: 0.03 10*3/uL (ref 0.00–0.07)
Basophils Absolute: 0.1 10*3/uL (ref 0.0–0.1)
Basophils Relative: 1 %
Eosinophils Absolute: 0.2 10*3/uL (ref 0.0–0.5)
Eosinophils Relative: 3 %
HCT: 38 % (ref 36.0–46.0)
Hemoglobin: 12.7 g/dL (ref 12.0–15.0)
Immature Granulocytes: 0 %
Lymphocytes Relative: 35 %
Lymphs Abs: 2.8 10*3/uL (ref 0.7–4.0)
MCH: 22.5 pg — ABNORMAL LOW (ref 26.0–34.0)
MCHC: 33.4 g/dL (ref 30.0–36.0)
MCV: 67.3 fL — ABNORMAL LOW (ref 80.0–100.0)
Monocytes Absolute: 0.6 10*3/uL (ref 0.1–1.0)
Monocytes Relative: 8 %
Neutro Abs: 4.4 10*3/uL (ref 1.7–7.7)
Neutrophils Relative %: 53 %
Platelet Count: 461 10*3/uL — ABNORMAL HIGH (ref 150–400)
RBC: 5.65 MIL/uL — ABNORMAL HIGH (ref 3.87–5.11)
RDW: 19.3 % — ABNORMAL HIGH (ref 11.5–15.5)
WBC Count: 8.2 10*3/uL (ref 4.0–10.5)
nRBC: 1 % — ABNORMAL HIGH (ref 0.0–0.2)

## 2022-07-19 LAB — IRON AND IRON BINDING CAPACITY (CC-WL,HP ONLY)
Iron: 89 ug/dL (ref 28–170)
Saturation Ratios: 26 % (ref 10.4–31.8)
TIBC: 340 ug/dL (ref 250–450)
UIBC: 251 ug/dL (ref 148–442)

## 2022-07-19 LAB — FERRITIN: Ferritin: 26 ng/mL (ref 11–307)

## 2022-07-19 NOTE — Progress Notes (Signed)
[facility] [phone]   [phone]  OFFICE PROGRESS NOTE  Avanell Shackleton, NP-C [address] 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   PRIOR THERAPY: Iron few region with Venofer 300 Mg IV weekly for 3 weeks.  Last dose was given in April 2024.  CURRENT THERAPY: Ferrous sulfate 325 mg p.o. on as-needed basis.  INTERVAL HISTORY: Regina Martin 37 y.o. female returns to the clinic today for follow-up visit.  The patient is feeling fine today with no concerning complaints.  She is feeling much better after the iron infusion.  She does not take any of the oral iron tablet at this point.  She denied having any chest pain, shortness of breath, cough or hemoptysis.  She has no nausea, vomiting, diarrhea or constipation.  She has no headache or visual changes.  She is here today for evaluation and repeat blood work.  MEDICAL HISTORY: Past Medical History:  Diagnosis Date   Abnormal Pap smear    lsil   Anemia    Collagen vascular disease (HCC)    History of ITP    Migraine     ALLERGIES:  is allergic to contrast media [iodinated contrast media].  MEDICATIONS:  Current Outpatient Medications  Medication Sig Dispense Refill   Cetirizine HCl (ZYRTEC ALLERGY PO) Take by mouth.     escitalopram (LEXAPRO) 5 MG tablet Take 1 tablet (5 mg total) by mouth daily. Follow-up appt is due must see Vickie for future refills 30 tablet 0   ferrous sulfate 325 (65 FE) MG EC tablet Take 325 mg by mouth 3 (three) times daily with meals.     ketoconazole (NIZORAL) 2 % shampoo Apply 1 Application topically as directed. Apply 1 tsp over port site at lease one hour prior to port access and cover with plastic wrap such as cling wrap..     NON FORMULARY Hair skin and nail vitamin 2 a day     pantoprazole (PROTONIX) 40 MG tablet TAKE 1 TABLET (40 MG  TOTAL) BY MOUTH TWICE A DAY BEFORE MEALS 60 tablet 2   tranexamic acid (LYSTEDA) 650 MG TABS tablet Take 1,300 mg by mouth 3 (three) times daily. Take the first 5 days of your menstrual cycle     Vitamin D, Ergocalciferol, (DRISDOL) 1.25 MG (50000 UNIT) CAPS capsule Take 1 capsule (50,000 Units total) by mouth every 7 (seven) days. 8 capsule 0   No current facility-administered medications for this visit.    SURGICAL HISTORY:  Past Surgical History:  Procedure Laterality Date   CHOLECYSTECTOMY     spleenectomy  1992   SPLENECTOMY, PARTIAL      REVIEW OF SYSTEMS:  A comprehensive review of systems was negative.   PHYSICAL EXAMINATION: General appearance: alert, cooperative, fatigued, and no distress Head: Normocephalic, without obvious abnormality, atraumatic Neck: no adenopathy, no JVD, supple, symmetrical, trachea midline, and thyroid not enlarged, symmetric, no tenderness/mass/nodules Lymph nodes: Cervical, supraclavicular, and axillary nodes normal. Resp: clear to auscultation bilaterally Back: symmetric, no curvature. ROM normal. No CVA tenderness. Cardio: regular rate and rhythm, S1, S2 normal, no murmur, click, rub or gallop GI: soft, non-tender; bowel sounds normal; no masses,  no organomegaly Extremities: extremities normal, atraumatic, no cyanosis or edema  ECOG PERFORMANCE STATUS: 0 - Asymptomatic  Blood pressure 109/69, pulse 66, temperature 98.2 F (36.8 C), temperature source Oral, resp. rate  18, height 5\' 3"  (1.6 m), weight 209 lb 6 oz (95 kg), last menstrual period 06/22/2022, SpO2 100%.  LABORATORY DATA: Lab Results  Component Value Date   WBC 7.2 04/05/2022   HGB 11.4 (L) 04/05/2022   HCT 33.6 (L) 04/05/2022   MCV 65.0 (L) 04/05/2022   PLT 516 (H) 04/05/2022      Chemistry      Component Value Date/Time   NA 140 04/05/2022 1138   K 4.0 04/05/2022 1138   CL 107 04/05/2022 1138   CO2 27 04/05/2022 1138   BUN 12 04/05/2022 1138   CREATININE 0.84  04/05/2022 1138   CREATININE 0.76 11/15/2019 1336      Component Value Date/Time   CALCIUM 9.6 04/05/2022 1138   ALKPHOS 75 04/05/2022 1138   AST 16 04/05/2022 1138   ALT 12 04/05/2022 1138   BILITOT 0.4 04/05/2022 1138       RADIOGRAPHIC STUDIES: No results found.  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  The patient was treated with iron infusion with Venofer 300 Mg IV weekly for 3 weeks and tolerated it fairly well.  She felt much better after the iron infusion. Repeat CBC today showed improvement of her hemoglobin to 12.7 and hematocrit 38.0.  MCV still low at 67.3 secondary to thalassemia.  Her platelets count also improved down to 461,000. I recommended for the patient to continue on observation for now with increase of her iron rich diet. I will see her back for follow-up visit in 3 months for evaluation with repeat blood work. She was advised to call immediately if she has any other concerning symptoms in the interval. The patient voices understanding of current disease status and treatment options and is in agreement with the current care plan.  All questions were answered. The patient knows to call the clinic with any problems, questions or concerns. We can certainly see the patient much sooner if necessary.  The total time spent in the appointment was 20 minutes.  Disclaimer: This note was dictated with voice recognition software. Similar sounding words can inadvertently be transcribed and may not be corrected upon review.

## 2022-08-16 ENCOUNTER — Telehealth: Payer: Self-pay | Admitting: Internal Medicine

## 2022-08-16 NOTE — Telephone Encounter (Signed)
Called patient regarding upcoming October appointments, patient is notified.  

## 2022-09-02 ENCOUNTER — Other Ambulatory Visit: Payer: Self-pay | Admitting: Family Medicine

## 2022-09-02 NOTE — Telephone Encounter (Signed)
LOV: 07/14/22 Last fill: 07/06/22, 30 tablet 0 refill

## 2022-09-13 ENCOUNTER — Encounter: Payer: Self-pay | Admitting: Physician Assistant

## 2022-09-13 ENCOUNTER — Ambulatory Visit: Payer: BC Managed Care – PPO | Admitting: Physician Assistant

## 2022-09-13 VITALS — BP 116/78 | HR 60 | Ht 63.0 in | Wt 209.0 lb

## 2022-09-13 DIAGNOSIS — R1013 Epigastric pain: Secondary | ICD-10-CM

## 2022-09-13 DIAGNOSIS — Z9049 Acquired absence of other specified parts of digestive tract: Secondary | ICD-10-CM | POA: Diagnosis not present

## 2022-09-13 DIAGNOSIS — R140 Abdominal distension (gaseous): Secondary | ICD-10-CM

## 2022-09-13 MED ORDER — DICYCLOMINE HCL 10 MG PO CAPS: ORAL_CAPSULE | ORAL | 8 refills | Status: AC

## 2022-09-13 NOTE — Progress Notes (Signed)
Subjective:    Patient ID: Regina Martin, female    DOB: [birth year], 37 y.o.   MRN: [id number]  HPI Regina Martin is a pleasant 37 year old African-American female , last seen in September 2023 by Doug Sou, PA-C and established with Dr. Rhea Belton at that time. She had been seen for abdominal discomfort in the upper abdomen and bloating.  She was treated with a course of PPI and lactose-free diet.  She related that her symptoms were similar to prior to having her gallbladder removed in 2020.  She apparently did not have gallstones but did have a dysfunctional gallbladder.  She also has prior history of H. pylori diagnosed elsewhere, and treated.  She comes in today stating that she had an episode in May 2024 with epigastric pain which she describes as sharp and constant and lasting for an hour or so and then gradually resolving.  This was associated with nausea but no vomiting, no diarrhea, no fever or chills no diaphoresis.  She has not had another episode since but also has been having ongoing problems with almost daily symptoms of mid abdominal bloating and sensation of trapped gas.  She says that she took one of her mother's dicyclomine tablets when she had the bad episode back in May and did find that to be helpful but has not tried dicyclomine for these more daily symptoms. She does feel that she is lactose intolerant and generally tries to avoid lactose but is not on a completely lactose-free diet.  She also states that greasy foods will cause urgency and diarrhea since she had her gallbladder removed but do not necessarily cause the bloating and gas.  No regular heartburn or indigestion. She has irregular bowel movements and does not necessarily have a bowel movement every day, no issues with ongoing diarrhea.  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 Shirline Frees.  She had recent iron  infusions with Venofer and most recent hemoglobin in July 2024 was 12.7.  She will follow-up there this fall.   Review of Systems Pertinent positive and negative review of systems were noted in the above HPI section.  All other review of systems was otherwise negative.   Outpatient Encounter Medications as of 09/13/2022  Medication Sig   Cetirizine HCl (ZYRTEC ALLERGY PO) Take by mouth.   dicyclomine (BENTYL) 10 MG capsule Take 1 tablet every morning, then every 6 hours as needed.   escitalopram (LEXAPRO) 5 MG tablet Take 1 tablet (5 mg total) by mouth daily.   ferrous sulfate 325 (65 FE) MG EC tablet Take 325 mg by mouth 3 (three) times daily with meals.   ketoconazole (NIZORAL) 2 % shampoo Apply 1 Application topically as directed. Apply 1 tsp over port site at lease one hour prior to port access and cover with plastic wrap such as cling wrap..   NON FORMULARY Hair skin and nail vitamin 2 a day   pantoprazole (PROTONIX) 40 MG tablet TAKE 1 TABLET (40 MG TOTAL) BY MOUTH TWICE A DAY BEFORE MEALS   tranexamic acid (LYSTEDA) 650 MG TABS tablet Take 1,300 mg by mouth 3 (three) times daily. Take the first 5 days of your menstrual cycle   Vitamin D, Ergocalciferol, (DRISDOL) 1.25 MG (50000 UNIT) CAPS capsule Take 1 capsule (50,000 Units total) by mouth every 7 (seven) days.   No facility-administered encounter medications on file as of 09/13/2022.   Allergies  Allergen Reactions  Contrast Media [Iodinated Contrast Media] Hives   Patient Active Problem List   Diagnosis Date Noted   Obesity (BMI 30-39.9) 07/14/2022   Neutrophilia 03/14/2022   Abnormal Pap smear of cervix 03/12/2022   Thrombocytosis 03/12/2022   Abdominal pain, epigastric 10/01/2021   Abdominal bloating 05/13/2021   Gastroesophageal reflux disease 05/13/2021   Iron deficiency anemia 05/13/2021   Fatigue 05/13/2021   Vitamin D deficiency 05/13/2021   Vitamin B12 deficiency 04/30/2019   GAD (generalized anxiety disorder)  04/30/2019   Seborrheic dermatitis 04/24/2019   Acne vulgaris 04/24/2019   History of gastric ulcer 02/27/2018   De Quervain's tenosynovitis, left 11/02/2016   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/2015   CIN I (cervical intraepithelial neoplasia I) 11/11/2010   Abnormal Pap smear    History of ITP    Social History   Socioeconomic History   Marital status: Married    Spouse name: Not on file   Number of children: 2   Years of education: Not on file   Highest education level: Not on file  Occupational History   Occupation: cma  Tobacco Use   Smoking status: Never   Smokeless tobacco: Never  Vaping Use   Vaping status: Never Used  Substance and Sexual Activity   Alcohol use: Yes    Comment: occ   Drug use: Never   Sexual activity: Yes    Birth control/protection: None, Pill  Other Topics Concern   Not on file  Social History Narrative   ** Merged History Encounter **       Social Determinants of Health   Financial Resource Strain: Not on file  Food Insecurity: Not on file  Transportation Needs: Not on file  Physical Activity: Not on file  Stress: Not on file  Social Connections: Not on file  Intimate Partner Violence: Not on file    Regina Martin family history includes Colon cancer in her maternal great-grandmother; Healthy in her father and mother; Seizures in her mother.      Objective:    Vitals:   09/13/22 1001  BP: 116/78  Pulse: 60    Physical Exam Well-developed well-nourished  AA female  in no acute distress.  Height, Weight, 209 BMI 37.02  HEENT; nontraumatic normocephalic, EOMI, PE R LA, sclera anicteric. Oropharynx; not examined today Neck; supple, no JVD Cardiovascular; regular rate and rhythm with S1-S2, no murmur rub or gallop Pulmonary; Clear bilaterally Abdomen; soft, no focal tenderness nondistended, no palpable mass or hepatosplenomegaly, bowel sounds are active Rectal; not done  today Skin; benign exam, no jaundice rash or appreciable lesions Extremities; no clubbing cyanosis or edema skin warm and dry Neuro/Psych; alert and oriented x4, grossly nonfocal mood and affect appropriate        Assessment & Plan:   #71 37 year old African-American female status post cholecystectomy 2020, with very sporadic recurring episodes of sharp epigastric pain associated with nausea and lasting for an hour or so with gradual resolution.  She last had an episode in May 2024, no recurrences since but had had similar prior episodes in the past.  She says these episodes are reminiscent to the symptoms she had prior to having her gallbladder removed but apparently did not have gallstones at the time of cholecystectomy.  Etiology of these episodes is not entirely clear, rule out IBS/spasm, consider sphincter of Oddi dysfunction,, consider dyspepsia  #2 ongoing issues with abdominal bloating and gas-I think at least a part of the symptoms are  secondary to lactose intolerance with continued lactose intake, will rule out component of SIBO, consider IBS  #3 remote history of H. pylori treated #4.  History of ITP #5.  Status post partial splenectomy #6.  Iron deficiency anemia, and thalassemia minor, followed by hematology, status post Venofer infusions earlier this summer most recent hemoglobin 12.7, and has follow-up with hematology.  Plan; We discussed lactose-free diet and she was given a copy.  In addition had like her to try taking 2 Lactaid tablets with each meal to see if this alleviates symptoms.  Prescription for Bentyl 10 mg has been sent, asked her to try 1 tablet each morning, may take further doses later in the day if needed.  Avoid greasy foods and other known triggers. Will proceed with breath testing for SIBO and treat if positive. Check hepatic panel today. Patient was asked to call and asked to speak to the nurse if she has further episodes of epigastric pain, would like  to check LFTs at the time of an episode. Further plans pending results and response to above.   Anne-Marie Genson S Angie Hogg PA-C 09/13/2022   Cc: Henson, Vickie L, NP-C

## 2022-09-13 NOTE — Patient Instructions (Signed)
_______________________________________________________  If your blood pressure at your visit was 140/90 or greater, please contact your primary care physician to follow up on this.  If you are age 36 or younger, your body mass index should be between 19-25. Your Body mass index is 37.02 kg/m. If this is out of the aformentioned range listed, please consider follow up with your Primary Care Provider.  _______________________________________________________  The [facility] providers would like to encourage you to use [facility] to communicate with providers for non-urgent requests or questions.  Due to long hold times on the telephone, sending your provider a message by [facility] may be a faster and more efficient way to get a response.  Please allow 48 business hours for a response.  Please remember that this is for non-urgent requests.  _______________________________________________________  We have sent the following medications to your pharmacy for you to pick up at your convenience:  START: Bentyl 10mg  take one every morning, then every 6 hours as needed.  Please purchase the following medications over the counter and take as directed:  START: Lactaid tablets 2 with each meal.  AVOID greasy foods.  Your provider has requested that you go to the basement level for lab work before leaving today. Press "B" on the elevator. The lab is located at the first door on the left as you exit the elevator.  You have been scheduled for an abdominal ultrasound at [facility] (1st floor of hospital) on 09-16-22 at 9:30am. Please arrive 15 minutes prior to your appointment for registration. Make certain not to have anything to eat or drink after midnight the night prior to your appointment. Should you need to reschedule your appointment, please contact radiology at [phone]. This test typically takes about 30 minutes to perform.  You have been given a testing kit to check for small intestine  bacterial overgrowth (SIBO) which is completed by a company named [employer]. Make sure to return your test in the mail using the return mailing label given to you along with the kit. The test order, your demographic and insurance information have all already been sent to the company. [facility] will collect an upfront charge of $99.74 for commercial insurance plans and $209.74 if you are paying cash. Make sure to discuss with [facility] PRIOR to having the test to see if they have gotten information from your insurance company as to how much your testing will cost out of pocket, if any. Please contact [facility] at phone number [phone] to get instructions regarding how to perform the test as our office is unable to give specific testing instructions.  Due to recent changes in healthcare laws, you may see the results of your imaging and laboratory studies on MyChart before your provider has had a chance to review them.  We understand that in some cases there may be results that are confusing or concerning to you. Not all laboratory results come back in the same time frame and the provider may be waiting for multiple results in order to interpret others.  Please give [place] 48 hours in order for your provider to thoroughly review all the results before contacting the office for clarification of your results.   Thank you for entrusting me with your care and choosing [facility].  Amy Esterwood, PA-C

## 2022-09-16 ENCOUNTER — Ambulatory Visit (HOSPITAL_COMMUNITY)
Admission: RE | Admit: 2022-09-16 | Discharge: 2022-09-16 | Disposition: A | Discharge status: Home / Self Care | Payer: BC Managed Care – PPO | Source: Ambulatory Visit | Attending: Physician Assistant | Admitting: Physician Assistant

## 2022-09-16 DIAGNOSIS — R140 Abdominal distension (gaseous): Secondary | ICD-10-CM | POA: Insufficient documentation

## 2022-09-16 DIAGNOSIS — Z9049 Acquired absence of other specified parts of digestive tract: Secondary | ICD-10-CM | POA: Diagnosis not present

## 2022-09-16 DIAGNOSIS — R1013 Epigastric pain: Secondary | ICD-10-CM | POA: Insufficient documentation

## 2022-09-16 DIAGNOSIS — Z9081 Acquired absence of spleen: Secondary | ICD-10-CM | POA: Diagnosis not present

## 2022-09-20 NOTE — Progress Notes (Signed)
Addendum: Reviewed and agree with assessment and management plan. Kadijah Shamoon M, MD  

## 2022-09-29 ENCOUNTER — Telehealth: Payer: Self-pay | Admitting: Physician Assistant

## 2022-09-29 NOTE — Telephone Encounter (Signed)
Inbound call from patient, would like to know if she can take imodium medication to counteract Lactulose medication if needed.

## 2022-09-29 NOTE — Telephone Encounter (Signed)
Patient reports she was reading the instructions for the SIBO breath test and noticed she has to take lactulose and it may cause her to have loose stools. Patient was wondering if she can take an Imodium if she has diarrhea. Patient states she did reach out to [facility] and they said to contact our office. Informed patient that she needs to finish the SIBO breath test and if she has loose stools after, she can take Imodium as needed. Patient verbalized understanding.

## 2022-09-30 DIAGNOSIS — R140 Abdominal distension (gaseous): Secondary | ICD-10-CM | POA: Diagnosis not present

## 2022-10-06 ENCOUNTER — Other Ambulatory Visit: Payer: Self-pay | Admitting: Gastroenterology

## 2022-10-18 ENCOUNTER — Other Ambulatory Visit: Payer: Self-pay | Admitting: Family

## 2022-10-18 ENCOUNTER — Other Ambulatory Visit: Payer: Self-pay

## 2022-10-18 ENCOUNTER — Other Ambulatory Visit: Payer: BC Managed Care – PPO

## 2022-10-18 DIAGNOSIS — R7989 Other specified abnormal findings of blood chemistry: Secondary | ICD-10-CM

## 2022-10-19 ENCOUNTER — Inpatient Hospital Stay: Payer: BC Managed Care – PPO | Attending: Internal Medicine

## 2022-10-19 ENCOUNTER — Inpatient Hospital Stay: Payer: BC Managed Care – PPO | Admitting: Internal Medicine

## 2022-10-19 VITALS — BP 116/79 | HR 98 | Temp 97.9°F | Resp 18 | Ht 63.0 in | Wt 211.9 lb

## 2022-10-19 DIAGNOSIS — D509 Iron deficiency anemia, unspecified: Secondary | ICD-10-CM | POA: Diagnosis not present

## 2022-10-19 DIAGNOSIS — N920 Excessive and frequent menstruation with regular cycle: Secondary | ICD-10-CM | POA: Diagnosis not present

## 2022-10-19 DIAGNOSIS — D508 Other iron deficiency anemias: Secondary | ICD-10-CM | POA: Diagnosis not present

## 2022-10-19 DIAGNOSIS — D500 Iron deficiency anemia secondary to blood loss (chronic): Secondary | ICD-10-CM

## 2022-10-19 DIAGNOSIS — D75838 Other thrombocytosis: Secondary | ICD-10-CM | POA: Insufficient documentation

## 2022-10-19 DIAGNOSIS — D563 Thalassemia minor: Secondary | ICD-10-CM | POA: Insufficient documentation

## 2022-10-19 LAB — HEPATIC FUNCTION PANEL
AG Ratio: 1.5 (calc) (ref 1.0–2.5)
ALT: 11 U/L (ref 6–29)
AST: 13 U/L (ref 10–30)
Albumin: 4.5 g/dL (ref 3.6–5.1)
Alkaline phosphatase (APISO): 89 U/L (ref 31–125)
Bilirubin, Direct: 0.1 mg/dL (ref 0.0–0.2)
Globulin: 3 g/dL (ref 1.9–3.7)
Indirect Bilirubin: 0.4 mg/dL (ref 0.2–1.2)
Total Bilirubin: 0.5 mg/dL (ref 0.2–1.2)
Total Protein: 7.5 g/dL (ref 6.1–8.1)

## 2022-10-19 LAB — CBC WITH DIFFERENTIAL (CANCER CENTER ONLY)
Abs Immature Granulocytes: 0.04 10*3/uL (ref 0.00–0.07)
Basophils Absolute: 0.1 10*3/uL (ref 0.0–0.1)
Basophils Relative: 1 %
Eosinophils Absolute: 0.4 10*3/uL (ref 0.0–0.5)
Eosinophils Relative: 4 %
HCT: 37.9 % (ref 36.0–46.0)
Hemoglobin: 13 g/dL (ref 12.0–15.0)
Immature Granulocytes: 0 %
Lymphocytes Relative: 36 %
Lymphs Abs: 3.9 10*3/uL (ref 0.7–4.0)
MCH: 23.2 pg — ABNORMAL LOW (ref 26.0–34.0)
MCHC: 34.3 g/dL (ref 30.0–36.0)
MCV: 67.7 fL — ABNORMAL LOW (ref 80.0–100.0)
Monocytes Absolute: 0.8 10*3/uL (ref 0.1–1.0)
Monocytes Relative: 8 %
Neutro Abs: 5.6 10*3/uL (ref 1.7–7.7)
Neutrophils Relative %: 51 %
Platelet Count: 484 10*3/uL — ABNORMAL HIGH (ref 150–400)
RBC: 5.6 MIL/uL — ABNORMAL HIGH (ref 3.87–5.11)
RDW: 18.2 % — ABNORMAL HIGH (ref 11.5–15.5)
WBC Count: 10.9 10*3/uL — ABNORMAL HIGH (ref 4.0–10.5)
nRBC: 0.8 % — ABNORMAL HIGH (ref 0.0–0.2)

## 2022-10-19 LAB — IRON AND IRON BINDING CAPACITY (CC-WL,HP ONLY)
Iron: 74 ug/dL (ref 28–170)
Saturation Ratios: 22 % (ref 10.4–31.8)
TIBC: 343 ug/dL (ref 250–450)
UIBC: 269 ug/dL (ref 148–442)

## 2022-10-19 NOTE — Progress Notes (Signed)
[facility] [phone]   [phone]  OFFICE PROGRESS NOTE  Regina Shackleton, NP-C [address] 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   PRIOR THERAPY: Iron few region with Venofer 300 Mg IV weekly for 3 weeks.  Last dose was given in April 2024.  CURRENT THERAPY: Ferrous sulfate 325 mg p.o. on as-needed basis.  INTERVAL HISTORY: Regina Martin 37 y.o. female returns to the clinic today for 47-month follow-up visit.  Discussed the use of AI scribe software for clinical note transcription with the patient, who gave verbal consent to proceed.  History of Present Illness   Regina Martin, a 37 year old with a history of iron deficiency anemia and thalassemia minor, presents for follow-up. She received an iron infusion six months ago, which improved her hemoglobin and iron levels. However, in the past month, she has experienced increased fatigue and headaches. She denies any dizzy spells but reports a recent craving for ice.  Her compliance with oral iron tablets (ferrous sulfate) is intermittent, taking them approximately three times a week due to tolerability issues. She has attempted to supplement her diet with increased red meat consumption.  Regarding her menstrual cycle, she reports it is well-managed with Lysteda, which she takes three times during her cycle, specifically on the heaviest days. She denies any clotting.  On physical examination, she denies any abdominal discomfort or leg swelling. The results of her recent lab work are pending at the time of the consultation.       MEDICAL HISTORY: Past Medical History:  Diagnosis Date   Abnormal Pap smear    lsil   Anemia    Collagen vascular disease (HCC)    History of ITP    Migraine     ALLERGIES:  is allergic to contrast media [iodinated contrast  media].  MEDICATIONS:  Current Outpatient Medications  Medication Sig Dispense Refill   Cetirizine HCl (ZYRTEC ALLERGY PO) Take by mouth.     dicyclomine (BENTYL) 10 MG capsule Take 1 tablet every morning, then every 6 hours as needed. 90 capsule 8   escitalopram (LEXAPRO) 5 MG tablet Take 1 tablet (5 mg total) by mouth daily. 30 tablet 5   ferrous sulfate 325 (65 FE) MG EC tablet Take 325 mg by mouth 3 (three) times daily with meals.     ketoconazole (NIZORAL) 2 % shampoo Apply 1 Application topically as directed. Apply 1 tsp over port site at lease one hour prior to port access and cover with plastic wrap such as cling wrap..     NON FORMULARY Hair skin and nail vitamin 2 a day     pantoprazole (PROTONIX) 40 MG tablet TAKE 1 TABLET (40 MG TOTAL) BY MOUTH TWICE A DAY BEFORE MEALS 60 tablet 2   tranexamic acid (LYSTEDA) 650 MG TABS tablet Take 1,300 mg by mouth 3 (three) times daily. Take the first 5 days of your menstrual cycle     Vitamin D, Ergocalciferol, (DRISDOL) 1.25 MG (50000 UNIT) CAPS capsule Take 1 capsule (50,000 Units total) by mouth every 7 (seven) days. 8 capsule 0   No current facility-administered medications for this visit.    SURGICAL HISTORY:  Past Surgical History:  Procedure Laterality Date   CHOLECYSTECTOMY     spleenectomy  1992   SPLENECTOMY, PARTIAL      REVIEW OF SYSTEMS:  A comprehensive review of  systems was negative except for: Constitutional: positive for fatigue   PHYSICAL EXAMINATION: General appearance: alert, cooperative, fatigued, and no distress Head: Normocephalic, without obvious abnormality, atraumatic Neck: no adenopathy, no JVD, supple, symmetrical, trachea midline, and thyroid not enlarged, symmetric, no tenderness/mass/nodules Lymph nodes: Cervical, supraclavicular, and axillary nodes normal. Resp: clear to auscultation bilaterally Back: symmetric, no curvature. ROM normal. No CVA tenderness. Cardio: regular rate and rhythm, S1, S2 normal,  no murmur, click, rub or gallop GI: soft, non-tender; bowel sounds normal; no masses,  no organomegaly Extremities: extremities normal, atraumatic, no cyanosis or edema  ECOG PERFORMANCE STATUS: 0 - Asymptomatic  Blood pressure 116/79, pulse 98, temperature 97.9 F (36.6 C), temperature source Oral, resp. rate 18, height 5\' 3"  (1.6 m), weight 211 lb 14.4 oz (96.1 kg), SpO2 100%.  LABORATORY DATA: Lab Results  Component Value Date   WBC 8.2 07/19/2022   HGB 12.7 07/19/2022   HCT 38.0 07/19/2022   MCV 67.3 (L) 07/19/2022   PLT 461 (H) 07/19/2022      Chemistry      Component Value Date/Time   NA 140 04/05/2022 1138   K 4.0 04/05/2022 1138   CL 107 04/05/2022 1138   CO2 27 04/05/2022 1138   BUN 12 04/05/2022 1138   CREATININE 0.84 04/05/2022 1138   CREATININE 0.76 11/15/2019 1336      Component Value Date/Time   CALCIUM 9.6 04/05/2022 1138   ALKPHOS 75 04/05/2022 1138   AST 13 10/18/2022 1618   AST 16 04/05/2022 1138   ALT 11 10/18/2022 1618   ALT 12 04/05/2022 1138   BILITOT 0.5 10/18/2022 1618   BILITOT 0.4 04/05/2022 1138       RADIOGRAPHIC STUDIES: No results found.  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  The patient was treated with iron infusion with Venofer 300 Mg IV weekly for 3 weeks and tolerated it fairly well.  She felt much better after the iron infusion. Assessment and Plan    Iron Deficiency Anemia Recent fatigue and pica (craving for ice). Patient has been taking oral iron supplements inconsistently and has increased iron-rich foods in her diet.  - CBC today showed hemoglobin of 13.0 and hematocrit 37.9 with MCV of 67.7.  Her platelets count were still elevated at 484,000.   Iron study and ferritin are still pending. -Continue oral iron supplements as tolerated. -If lab results  indicate low levels, arrange for iron infusion, considering current IV fluid shortage. -If lab results are not significantly low, continue current management and reassess in three months.  Menorrhagia Managed with Lysteda during menstrual cycle. Reports lighter bleeding and no clots. -Continue current management with Lysteda.  Follow-up in three months, regardless of whether iron infusion is administered in the interim.   She was advised to call immediately if she has any other concerning symptoms in the interval. The patient voices understanding of current disease status and treatment options and is in agreement with the current care plan.  All questions were answered. The patient knows to call the clinic with any problems, questions or concerns. We can certainly see the patient much sooner if necessary.  The total time spent in the appointment was 20 minutes.  Disclaimer: This note was dictated with voice recognition software. Similar sounding words can inadvertently be transcribed and may not be corrected upon review.

## 2022-10-20 LAB — FERRITIN: Ferritin: 12 ng/mL (ref 11–307)

## 2022-10-22 ENCOUNTER — Ambulatory Visit: Payer: BC Managed Care – PPO | Admitting: Family Medicine

## 2022-10-22 ENCOUNTER — Telehealth: Payer: Self-pay

## 2022-10-22 NOTE — Telephone Encounter (Signed)
Patient contacted and advised of the negative breath test.

## 2022-10-22 NOTE — Telephone Encounter (Signed)
-----   Message from Mike Gip sent at 10/20/2022 12:56 PM EDT ----- Regarding: SIBO test Please call pt and let her know the breath test for SIBO was negative

## 2022-12-13 ENCOUNTER — Other Ambulatory Visit: Payer: Self-pay | Admitting: Infectious Diseases

## 2022-12-13 ENCOUNTER — Other Ambulatory Visit: Payer: BC Managed Care – PPO

## 2022-12-13 ENCOUNTER — Ambulatory Visit: Payer: BC Managed Care – PPO

## 2022-12-13 ENCOUNTER — Other Ambulatory Visit: Payer: Self-pay

## 2022-12-13 DIAGNOSIS — R109 Unspecified abdominal pain: Secondary | ICD-10-CM

## 2022-12-14 LAB — URINE CULTURE
MICRO NUMBER:: 15826635
Result:: NO GROWTH
SPECIMEN QUALITY:: ADEQUATE

## 2022-12-14 LAB — URINALYSIS, ROUTINE W REFLEX MICROSCOPIC
Bacteria, UA: NONE SEEN /[HPF]
Bilirubin Urine: NEGATIVE
Glucose, UA: NEGATIVE
Hgb urine dipstick: NEGATIVE
Hyaline Cast: NONE SEEN /[LPF]
Ketones, ur: NEGATIVE
Nitrite: NEGATIVE
Protein, ur: NEGATIVE
RBC / HPF: NONE SEEN /[HPF] (ref 0–2)
Specific Gravity, Urine: 1.023 (ref 1.001–1.035)
pH: 7 (ref 5.0–8.0)

## 2022-12-14 LAB — MICROSCOPIC MESSAGE

## 2022-12-20 ENCOUNTER — Ambulatory Visit: Payer: BC Managed Care – PPO | Admitting: Family Medicine

## 2022-12-20 ENCOUNTER — Encounter: Payer: Self-pay | Admitting: Family Medicine

## 2022-12-20 VITALS — BP 110/74 | HR 75 | Temp 97.6°F | Ht 63.0 in | Wt 214.0 lb

## 2022-12-20 DIAGNOSIS — E669 Obesity, unspecified: Secondary | ICD-10-CM

## 2022-12-20 DIAGNOSIS — Z6837 Body mass index (BMI) 37.0-37.9, adult: Secondary | ICD-10-CM

## 2022-12-20 MED ORDER — ZEPBOUND 2.5 MG/0.5ML ~~LOC~~ SOAJ
2.5000 mg | SUBCUTANEOUS | 1 refills | Status: DC
Start: 2022-12-20 — End: 2023-03-15

## 2022-12-20 NOTE — Patient Instructions (Signed)
Stay hydrated, eat small frequent amounts of food and low fat.

## 2022-12-20 NOTE — Progress Notes (Signed)
Subjective:     Patient ID: Regina [facility], female    DOB: 12/23/1985, 37 y.o.   MRN: [id number]  Chief Complaint  Patient presents with   Weight Loss    Wants to discuss weight loss options    HPI   History of Present Illness          Here with concerns regarding weight.   States she loses and gains weight.   Tried Weight Watchers in the past.   She has cut back on sweets and carbs. Drinking more water.   Exercises 2-3 times per week   Uses condoms and no plans to get pregnant.   Her husband is on Zepbound.   Denies hx of pancreatitis or family hx of thyroid cancer.    Health Maintenance Due  Topic Date Due   HIV Screening  Never done   Hepatitis C Screening  Never done    Past Medical History:  Diagnosis Date   Abnormal Pap smear    lsil   Anemia    Collagen vascular disease (HCC)    History of ITP    Migraine     Past Surgical History:  Procedure Laterality Date   CHOLECYSTECTOMY     spleenectomy  1992   SPLENECTOMY, PARTIAL      Family History  Problem Relation Age of Onset   Healthy Mother    Seizures Mother    Healthy Father    Colon cancer Maternal Great-grandmother    Esophageal cancer Neg Hx    Stomach cancer Neg Hx     Social History   Socioeconomic History   Marital status: Married    Spouse name: Not on file   Number of children: 2   Years of education: Not on file   Highest education level: Not on file  Occupational History   Occupation: cma  Tobacco Use   Smoking status: Never   Smokeless tobacco: Never  Vaping Use   Vaping status: Never Used  Substance and Sexual Activity   Alcohol use: Yes    Comment: occ   Drug use: Never   Sexual activity: Yes    Birth control/protection: None, Pill  Other Topics Concern   Not on file  Social History Narrative   ** Merged History Encounter **       Social Drivers of Corporate investment banker Strain: Not on file  Food Insecurity: Not on file  Transportation  Needs: Not on file  Physical Activity: Not on file  Stress: Not on file  Social Connections: Not on file  Intimate Partner Violence: Not on file    Outpatient Medications Prior to Visit  Medication Sig Dispense Refill   Cetirizine HCl (ZYRTEC ALLERGY PO) Take by mouth.     dicyclomine (BENTYL) 10 MG capsule Take 1 tablet every morning, then every 6 hours as needed. 90 capsule 8   escitalopram (LEXAPRO) 5 MG tablet Take 1 tablet (5 mg total) by mouth daily. 30 tablet 5   ferrous sulfate 325 (65 FE) MG EC tablet Take 325 mg by mouth 3 (three) times daily with meals.     ketoconazole (NIZORAL) 2 % shampoo Apply 1 Application topically as directed. Apply 1 tsp over port site at lease one hour prior to port access and cover with plastic wrap such as cling wrap..     NON FORMULARY Hair skin and nail vitamin 2 a day     pantoprazole (PROTONIX) 40 MG tablet TAKE 1 TABLET (40 MG  TOTAL) BY MOUTH TWICE A DAY BEFORE MEALS 60 tablet 2   tranexamic acid (LYSTEDA) 650 MG TABS tablet Take 1,300 mg by mouth 3 (three) times daily. Take the first 5 days of your menstrual cycle     Vitamin D, Ergocalciferol, (DRISDOL) 1.25 MG (50000 UNIT) CAPS capsule Take 1 capsule (50,000 Units total) by mouth every 7 (seven) days. 8 capsule 0   No facility-administered medications prior to visit.    Allergies  Allergen Reactions   Contrast Media [Iodinated Contrast Media] Hives    Review of Systems  Constitutional:  Negative for chills, fever, malaise/fatigue and weight loss.  Respiratory:  Negative for shortness of breath.   Cardiovascular:  Negative for chest pain, palpitations and leg swelling.  Gastrointestinal:  Negative for abdominal pain, constipation, diarrhea, nausea and vomiting.  Genitourinary:  Negative for dysuria, frequency and urgency.  Neurological:  Negative for dizziness and focal weakness.       Objective:    Physical Exam Constitutional:      General: She is not in acute distress.     Appearance: She is not ill-appearing.  Eyes:     Extraocular Movements: Extraocular movements intact.     Conjunctiva/sclera: Conjunctivae normal.  Cardiovascular:     Rate and Rhythm: Normal rate.  Pulmonary:     Effort: Pulmonary effort is normal.  Musculoskeletal:     Cervical back: Normal range of motion and neck supple.  Skin:    General: Skin is warm and dry.  Neurological:     General: No focal deficit present.     Mental Status: She is alert and oriented to person, place, and time.  Psychiatric:        Mood and Affect: Mood normal.        Behavior: Behavior normal.        Thought Content: Thought content normal.      BP 110/74 (BP Location: Left Arm, Patient Position: Sitting, Cuff Size: Large)   Pulse 75   Temp 97.6 F (36.4 C) (Temporal)   Ht 5\' 3"  (1.6 m)   Wt 214 lb (97.1 kg)   SpO2 98%   BMI 37.91 kg/m  Wt Readings from Last 3 Encounters:  12/20/22 214 lb (97.1 kg)  10/19/22 211 lb 14.4 oz (96.1 kg)  09/13/22 209 lb (94.8 kg)       Assessment & Plan:   Problem List Items Addressed This Visit     Obesity (BMI 30-39.9) - Primary   Relevant Medications   tirzepatide (ZEPBOUND) 2.5 MG/0.5ML Pen   Counseling on Zepbound, how to take the medication, potential side effects and how to be successful on the medication. Advised her pregnancy is not safe on the medication and to avoid risk. She may want to reach out to her OB/GYN for additional birth control.  Continue with a low fat diet, eat small amounts and stay hydrated. Increase exercise as tolerated. Follow up in 2 months.   I am having La Broccoli start on Zepbound. I am also having her maintain her Cetirizine HCl (ZYRTEC ALLERGY PO), ferrous sulfate, NON FORMULARY, Vitamin D (Ergocalciferol), tranexamic acid, ketoconazole, escitalopram, dicyclomine, and pantoprazole.  Meds ordered this encounter  Medications   tirzepatide (ZEPBOUND) 2.5 MG/0.5ML Pen    Sig: Inject 2.5 mg into the skin once a week.     Dispense:  2 mL    Refill:  1    Supervising Provider:   Hillard Danker A [[id number]]

## 2022-12-20 NOTE — Telephone Encounter (Signed)
Please advise 

## 2022-12-21 ENCOUNTER — Encounter: Payer: Self-pay | Admitting: Internal Medicine

## 2022-12-23 ENCOUNTER — Inpatient Hospital Stay: Payer: BC Managed Care – PPO | Attending: Internal Medicine

## 2022-12-23 ENCOUNTER — Other Ambulatory Visit: Payer: Self-pay | Admitting: *Deleted

## 2022-12-23 ENCOUNTER — Other Ambulatory Visit: Payer: Self-pay

## 2022-12-23 DIAGNOSIS — Z79899 Other long term (current) drug therapy: Secondary | ICD-10-CM | POA: Diagnosis not present

## 2022-12-23 DIAGNOSIS — D75838 Other thrombocytosis: Secondary | ICD-10-CM | POA: Diagnosis not present

## 2022-12-23 DIAGNOSIS — D509 Iron deficiency anemia, unspecified: Secondary | ICD-10-CM | POA: Insufficient documentation

## 2022-12-23 DIAGNOSIS — D563 Thalassemia minor: Secondary | ICD-10-CM | POA: Diagnosis not present

## 2022-12-23 DIAGNOSIS — D500 Iron deficiency anemia secondary to blood loss (chronic): Secondary | ICD-10-CM

## 2022-12-23 LAB — CBC WITH DIFFERENTIAL (CANCER CENTER ONLY)
Abs Immature Granulocytes: 0.03 10*3/uL (ref 0.00–0.07)
Basophils Absolute: 0.1 10*3/uL (ref 0.0–0.1)
Basophils Relative: 1 %
Eosinophils Absolute: 0.5 10*3/uL (ref 0.0–0.5)
Eosinophils Relative: 4 %
HCT: 38 % (ref 36.0–46.0)
Hemoglobin: 12.7 g/dL (ref 12.0–15.0)
Immature Granulocytes: 0 %
Lymphocytes Relative: 37 %
Lymphs Abs: 4.3 10*3/uL — ABNORMAL HIGH (ref 0.7–4.0)
MCH: 22.5 pg — ABNORMAL LOW (ref 26.0–34.0)
MCHC: 33.4 g/dL (ref 30.0–36.0)
MCV: 67.4 fL — ABNORMAL LOW (ref 80.0–100.0)
Monocytes Absolute: 0.8 10*3/uL (ref 0.1–1.0)
Monocytes Relative: 6 %
Neutro Abs: 6.1 10*3/uL (ref 1.7–7.7)
Neutrophils Relative %: 52 %
Platelet Count: 422 10*3/uL — ABNORMAL HIGH (ref 150–400)
RBC: 5.64 MIL/uL — ABNORMAL HIGH (ref 3.87–5.11)
RDW: 18.5 % — ABNORMAL HIGH (ref 11.5–15.5)
WBC Count: 11.7 10*3/uL — ABNORMAL HIGH (ref 4.0–10.5)
nRBC: 0.4 % — ABNORMAL HIGH (ref 0.0–0.2)

## 2022-12-23 LAB — IRON AND IRON BINDING CAPACITY (CC-WL,HP ONLY)
Iron: 62 ug/dL (ref 28–170)
Saturation Ratios: 17 % (ref 10.4–31.8)
TIBC: 370 ug/dL (ref 250–450)
UIBC: 308 ug/dL (ref 148–442)

## 2022-12-24 LAB — FERRITIN: Ferritin: 22 ng/mL (ref 11–307)

## 2023-01-03 ENCOUNTER — Encounter: Payer: Self-pay | Admitting: Family Medicine

## 2023-01-03 NOTE — Telephone Encounter (Signed)
Please initiate PA for Zepbound

## 2023-01-04 ENCOUNTER — Telehealth: Payer: Self-pay

## 2023-01-04 ENCOUNTER — Other Ambulatory Visit (HOSPITAL_COMMUNITY): Payer: Self-pay

## 2023-01-04 NOTE — Telephone Encounter (Signed)
 Pharmacy Patient Advocate Encounter   Received notification from Patient Advice Request messages that prior authorization for Zepbound  2.5MG /0.5ML pen-injectors is required/requested.   Insurance verification completed.   The patient is insured through [facility] .   Per test claim: PA required and submitted KEY/EOC/Request #: [id number] APPROVED from 01/04/23 to 09/01/23. Ran test claim, Copay is $0. This test claim was processed through [facility]- copay amounts may vary at other pharmacies due to pharmacy/plan contracts, or as the patient moves through the different stages of their insurance plan.   PA Case ID #: [id number]

## 2023-01-14 ENCOUNTER — Ambulatory Visit: Payer: BC Managed Care – PPO | Admitting: Family Medicine

## 2023-01-18 ENCOUNTER — Telehealth: Payer: Self-pay | Admitting: Internal Medicine

## 2023-01-19 ENCOUNTER — Other Ambulatory Visit: Payer: BC Managed Care – PPO

## 2023-01-19 ENCOUNTER — Ambulatory Visit: Payer: BC Managed Care – PPO | Admitting: Internal Medicine

## 2023-01-28 ENCOUNTER — Ambulatory Visit: Payer: BC Managed Care – PPO | Admitting: Family Medicine

## 2023-01-28 ENCOUNTER — Telehealth: Payer: Self-pay | Admitting: Physician Assistant

## 2023-01-28 NOTE — Telephone Encounter (Signed)
Patient left a VM stating she wanted to cancel her appointments. Called patient to confirm and got her rescheduled.

## 2023-01-31 ENCOUNTER — Ambulatory Visit: Payer: BC Managed Care – PPO | Admitting: Internal Medicine

## 2023-01-31 ENCOUNTER — Other Ambulatory Visit: Payer: BC Managed Care – PPO

## 2023-02-01 NOTE — Progress Notes (Signed)
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   PRIOR THERAPY: Iron few region with Venofer 300 Mg IV weekly for 3 weeks. Last dose was given on 05/05/22.   CURRENT THERAPY: Ferrous sulfate 325 mg p.o. on as-needed basis.   INTERVAL HISTORY: Regina Martin 38 y.o. female returns to the clinic today for a follow-up visit.  The patient was last seen in the clinic on 10/19/2022.  The patient is followed for history of iron deficiency anemia as well as thalassemia minor.  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 She will is currently taking iron supplements over-the-counter 1 tablet p.o. daily around the time of her menstrual cycle. She does get constipation and nausea sometimes with her over the counter ferrous sulfate.  She has not needed an iron infusion since 05/05/2022.  In the interval since last being seen, she is feeling "fine".  She denies any shortness of breath, lightheadedness, or syncope.  Denies any visible bleeding or bruising including epistaxis, gingival bleeding, hemoptysis, hematemesis, melena, or hematochezia. She does have some bruising on her upper extremity.   Her menstrual cycles have improved with taking Lysteda. She denies craving ice chips at this time. She is here today for evaluation and repeat blood work.   MEDICAL HISTORY: Past Medical History:  Diagnosis Date   Abnormal Pap smear    lsil   Anemia    Collagen vascular disease (HCC)    History of ITP    Migraine     ALLERGIES:  is allergic to contrast media [iodinated contrast media].  MEDICATIONS:  Current Outpatient Medications  Medication Sig Dispense Refill   Cetirizine HCl (ZYRTEC ALLERGY PO) Take by mouth.     dicyclomine (BENTYL) 10 MG capsule Take 1 tablet every morning,  then every 6 hours as needed. 90 capsule 8   escitalopram (LEXAPRO) 5 MG tablet Take 1 tablet (5 mg total) by mouth daily. 30 tablet 5   ferrous sulfate 325 (65 FE) MG EC tablet Take 325 mg by mouth 3 (three) times daily with meals.     NON FORMULARY Hair skin and nail vitamin 2 a day     pantoprazole (PROTONIX) 40 MG tablet TAKE 1 TABLET (40 MG TOTAL) BY MOUTH TWICE A DAY BEFORE MEALS 60 tablet 2   tirzepatide (ZEPBOUND) 2.5 MG/0.5ML Pen Inject 2.5 mg into the skin once a week. 2 mL 1   tranexamic acid (LYSTEDA) 650 MG TABS tablet Take 1,300 mg by mouth 3 (three) times daily. Take the first 5 days of your menstrual cycle     No current facility-administered medications for this visit.    SURGICAL HISTORY:  Past Surgical History:  Procedure Laterality Date   CHOLECYSTECTOMY     spleenectomy  1992   SPLENECTOMY, PARTIAL      REVIEW OF SYSTEMS:   Review of Systems  Constitutional: Positive for fatigue. Negative for appetite change, chills, fever and unexpected weight change.  HENT: Negative for mouth sores, nosebleeds, sore throat and trouble swallowing.   Eyes: Negative for eye problems and icterus.  Respiratory: Negative for cough, hemoptysis, shortness of breath and wheezing.   Cardiovascular: Negative for chest pain and leg swelling.  Gastrointestinal: Negative for abdominal pain, constipation, diarrhea, nausea and vomiting.  Genitourinary: Negative for bladder  incontinence, difficulty urinating, dysuria, frequency and hematuria.   Musculoskeletal: Negative for back pain, gait problem, neck pain and neck stiffness.  Skin: Negative for itching and rash.  Neurological: Negative for dizziness, extremity weakness, gait problem, headaches, light-headedness and seizures.  Hematological: Negative for adenopathy. Does not bleed easily.   Psychiatric/Behavioral: Negative for confusion, depression and sleep disturbance. The patient is not nervous/anxious.     PHYSICAL EXAMINATION:  Blood  pressure 105/72, pulse 80, temperature 98.2 F (36.8 C), temperature source Temporal, resp. rate 17, weight 208 lb 4.8 oz (94.5 kg), SpO2 100%.  ECOG PERFORMANCE STATUS: 1  Physical Exam  Constitutional: Oriented to person, place, and time and well-developed, well-nourished, and in no distress.  HENT:  Head: Normocephalic and atraumatic.  Mouth/Throat: Oropharynx is clear and moist. No oropharyngeal exudate.  Eyes: Conjunctivae are normal. Right eye exhibits no discharge. Left eye exhibits no discharge. No scleral icterus.  Neck: Normal range of motion. Neck supple.  Cardiovascular: Normal rate, regular rhythm, normal heart sounds and intact distal pulses.   Pulmonary/Chest: Effort normal and breath sounds normal. No respiratory distress. No wheezes. No rales.  Abdominal: Soft. Bowel sounds are normal. Exhibits no distension and no mass. There is no tenderness.  Musculoskeletal: Normal range of motion. Exhibits no edema.  Lymphadenopathy:    No cervical adenopathy.  Neurological: Alert and oriented to person, place, and time. Exhibits normal muscle tone. Gait normal. Coordination normal.  Skin: Positive for some bruising. Skin is warm and dry. No rash noted. Not diaphoretic. No erythema. No pallor.  Psychiatric: Mood, memory and judgment normal.  Vitals reviewed.  LABORATORY DATA: Lab Results  Component Value Date   WBC 9.6 02/04/2023   HGB 13.2 02/04/2023   HCT 40.1 02/04/2023   MCV 67.7 (L) 02/04/2023   PLT 339 02/04/2023      Chemistry      Component Value Date/Time   NA 140 04/05/2022 1138   K 4.0 04/05/2022 1138   CL 107 04/05/2022 1138   CO2 27 04/05/2022 1138   BUN 12 04/05/2022 1138   CREATININE 0.84 04/05/2022 1138   CREATININE 0.76 11/15/2019 1336      Component Value Date/Time   CALCIUM 9.6 04/05/2022 1138   ALKPHOS 75 04/05/2022 1138   AST 13 10/18/2022 1618   AST 16 04/05/2022 1138   ALT 11 10/18/2022 1618   ALT 12 04/05/2022 1138   BILITOT 0.5  10/18/2022 1618   BILITOT 0.4 04/05/2022 1138       RADIOGRAPHIC STUDIES:  No results found.   ASSESSMENT/PLAN:  This is a very pleasant 38 year old African-American female with iron deficiency anemia as well as thalassemia minor.  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  She is previously received IV iron with Venofer 300 mg weekly x 3, the most recent dose being on 05/05/2022.  She is currently taking iron supplements with ferrous sulfate when she has menstrual periods.   Labs were reviewed. Her CBC shows normal WBC of 9.6, normal hemoglobin of 13.2 and normal platelet count of 339k.   Her iron studies are pending. I do not believe she will need IV iron unless her iron is low. I will send her a mychart message with results and instruction.   She will continue taking her oral iron supplement 1 tablet p.o. daily. I let her know some iron even if low content is likely better than none if she has some issues with tolerability of ferrous sulfate. We discussed slow release  iron, liquid iron, or multivitamin with iron.   She was advised to take her iron supplement with vitamin C to help with iron absorption.  We will see her back for labs and follow-up visit in 4 months.  She will continue to follow with GYN intake Lysteda for her menstrual cycles.  The patient was advised to call immediately if she has any concerning symptoms in the interval. The patient voices understanding of current disease status and treatment options and is in agreement with the current care plan. All questions were answered. The patient knows to call the clinic with any problems, questions or concerns. We can certainly see the patient much sooner if necessary     No orders of the defined types were placed in this encounter.   The total time spent in the appointment was 20-29 minutes  Mali Eppard L Ineze Serrao, PA-C 02/04/23

## 2023-02-04 ENCOUNTER — Inpatient Hospital Stay: Payer: BC Managed Care – PPO | Attending: Internal Medicine

## 2023-02-04 ENCOUNTER — Encounter: Payer: Self-pay | Admitting: Family Medicine

## 2023-02-04 ENCOUNTER — Inpatient Hospital Stay: Payer: BC Managed Care – PPO | Admitting: Physician Assistant

## 2023-02-04 ENCOUNTER — Ambulatory Visit: Payer: BC Managed Care – PPO | Admitting: Family Medicine

## 2023-02-04 VITALS — BP 105/72 | HR 80 | Temp 98.2°F | Resp 17 | Wt 208.3 lb

## 2023-02-04 VITALS — BP 112/74 | HR 76 | Temp 97.8°F | Ht 63.0 in | Wt 208.0 lb

## 2023-02-04 DIAGNOSIS — D500 Iron deficiency anemia secondary to blood loss (chronic): Secondary | ICD-10-CM | POA: Diagnosis not present

## 2023-02-04 DIAGNOSIS — Z79899 Other long term (current) drug therapy: Secondary | ICD-10-CM | POA: Insufficient documentation

## 2023-02-04 DIAGNOSIS — J3089 Other allergic rhinitis: Secondary | ICD-10-CM | POA: Diagnosis not present

## 2023-02-04 DIAGNOSIS — K5900 Constipation, unspecified: Secondary | ICD-10-CM | POA: Insufficient documentation

## 2023-02-04 DIAGNOSIS — D75838 Other thrombocytosis: Secondary | ICD-10-CM | POA: Insufficient documentation

## 2023-02-04 DIAGNOSIS — D563 Thalassemia minor: Secondary | ICD-10-CM | POA: Insufficient documentation

## 2023-02-04 DIAGNOSIS — R110 Nausea: Secondary | ICD-10-CM | POA: Diagnosis not present

## 2023-02-04 DIAGNOSIS — D508 Other iron deficiency anemias: Secondary | ICD-10-CM

## 2023-02-04 DIAGNOSIS — R5383 Other fatigue: Secondary | ICD-10-CM | POA: Diagnosis not present

## 2023-02-04 DIAGNOSIS — D509 Iron deficiency anemia, unspecified: Secondary | ICD-10-CM | POA: Insufficient documentation

## 2023-02-04 DIAGNOSIS — E669 Obesity, unspecified: Secondary | ICD-10-CM | POA: Diagnosis not present

## 2023-02-04 LAB — CBC WITH DIFFERENTIAL (CANCER CENTER ONLY)
Abs Immature Granulocytes: 0.03 10*3/uL (ref 0.00–0.07)
Basophils Absolute: 0.1 10*3/uL (ref 0.0–0.1)
Basophils Relative: 1 %
Eosinophils Absolute: 0.3 10*3/uL (ref 0.0–0.5)
Eosinophils Relative: 3 %
HCT: 40.1 % (ref 36.0–46.0)
Hemoglobin: 13.2 g/dL (ref 12.0–15.0)
Immature Granulocytes: 0 %
Lymphocytes Relative: 28 %
Lymphs Abs: 2.7 10*3/uL (ref 0.7–4.0)
MCH: 22.3 pg — ABNORMAL LOW (ref 26.0–34.0)
MCHC: 32.9 g/dL (ref 30.0–36.0)
MCV: 67.7 fL — ABNORMAL LOW (ref 80.0–100.0)
Monocytes Absolute: 0.6 10*3/uL (ref 0.1–1.0)
Monocytes Relative: 7 %
Neutro Abs: 5.8 10*3/uL (ref 1.7–7.7)
Neutrophils Relative %: 61 %
Platelet Count: 339 10*3/uL (ref 150–400)
RBC: 5.92 MIL/uL — ABNORMAL HIGH (ref 3.87–5.11)
RDW: 17.3 % — ABNORMAL HIGH (ref 11.5–15.5)
WBC Count: 9.6 10*3/uL (ref 4.0–10.5)
nRBC: 0.2 % (ref 0.0–0.2)

## 2023-02-04 LAB — IRON AND IRON BINDING CAPACITY (CC-WL,HP ONLY)
Iron: 99 ug/dL (ref 28–170)
Saturation Ratios: 28 % (ref 10.4–31.8)
TIBC: 358 ug/dL (ref 250–450)
UIBC: 259 ug/dL (ref 148–442)

## 2023-02-04 LAB — FERRITIN: Ferritin: 16 ng/mL (ref 11–307)

## 2023-02-04 NOTE — Progress Notes (Signed)
Subjective:     Patient ID: Regina Martin, female    DOB: 06-07-1985, 38 y.o.   MRN: [id number]  Chief Complaint  Patient presents with   Follow-up    F/u after starting zepbound    HPI   History of Present Illness         She is here for follow-up on obesity.  Started on Zepbound and has taken her fourth dose.  She is doing well.  No significant side effects.  Mild nausea initially.  Complains of itching on her upper arms and the back of her knees 1 week before her menstrual cycle.  She has been taking Zyrtec for her allergies for years.  Feels well otherwise.   Health Maintenance Due  Topic Date Due   HIV Screening  Never done   Hepatitis C Screening  Never done   COVID-19 Vaccine (3 - Pfizer risk series) 10/18/2019    Past Medical History:  Diagnosis Date   Abnormal Pap smear    lsil   Anemia    Collagen vascular disease (HCC)    History of ITP    Migraine     Past Surgical History:  Procedure Laterality Date   CHOLECYSTECTOMY     spleenectomy  1992   SPLENECTOMY, PARTIAL      Family History  Problem Relation Age of Onset   Healthy Mother    Seizures Mother    Healthy Father    Colon cancer Maternal Great-grandmother    Esophageal cancer Neg Hx    Stomach cancer Neg Hx     Social History   Socioeconomic History   Marital status: Married    Spouse name: Not on file   Number of children: 2   Years of education: Not on file   Highest education level: Not on file  Occupational History   Occupation: cma  Tobacco Use   Smoking status: Never   Smokeless tobacco: Never  Vaping Use   Vaping status: Never Used  Substance and Sexual Activity   Alcohol use: Yes    Comment: occ   Drug use: Never   Sexual activity: Yes    Birth control/protection: None, Pill  Other Topics Concern   Not on file  Social History Narrative   ** Merged History Encounter **       Social Drivers of Corporate investment banker Strain: Not on file  Food  Insecurity: Not on file  Transportation Needs: Not on file  Physical Activity: Not on file  Stress: Not on file  Social Connections: Not on file  Intimate Partner Violence: Not on file    Outpatient Medications Prior to Visit  Medication Sig Dispense Refill   Cetirizine HCl (ZYRTEC ALLERGY PO) Take by mouth.     dicyclomine (BENTYL) 10 MG capsule Take 1 tablet every morning, then every 6 hours as needed. 90 capsule 8   escitalopram (LEXAPRO) 5 MG tablet Take 1 tablet (5 mg total) by mouth daily. 30 tablet 5   ferrous sulfate 325 (65 FE) MG EC tablet Take 325 mg by mouth 3 (three) times daily with meals.     NON FORMULARY Hair skin and nail vitamin 2 a day     pantoprazole (PROTONIX) 40 MG tablet TAKE 1 TABLET (40 MG TOTAL) BY MOUTH TWICE A DAY BEFORE MEALS 60 tablet 2   tirzepatide (ZEPBOUND) 2.5 MG/0.5ML Pen Inject 2.5 mg into the skin once a week. 2 mL 1   tranexamic acid (LYSTEDA) 650 MG  TABS tablet Take 1,300 mg by mouth 3 (three) times daily. Take the first 5 days of your menstrual cycle     No facility-administered medications prior to visit.    Allergies  Allergen Reactions   Contrast Media [Iodinated Contrast Media] Hives    Review of Systems  Constitutional:  Positive for weight loss. Negative for chills, fever and malaise/fatigue.       Intentional   Respiratory:  Negative for shortness of breath.   Cardiovascular:  Negative for chest pain, palpitations and leg swelling.  Gastrointestinal:  Negative for abdominal pain, constipation, diarrhea, nausea and vomiting.  Skin:  Positive for itching.  Neurological:  Negative for dizziness and focal weakness.  Psychiatric/Behavioral:  Negative for depression. The patient is not nervous/anxious.        Objective:    Physical Exam Constitutional:      General: She is not in acute distress.    Appearance: She is not ill-appearing.  Eyes:     Extraocular Movements: Extraocular movements intact.     Conjunctiva/sclera:  Conjunctivae normal.  Cardiovascular:     Rate and Rhythm: Normal rate.  Pulmonary:     Effort: Pulmonary effort is normal.  Musculoskeletal:     Cervical back: Normal range of motion and neck supple.  Skin:    General: Skin is warm and dry.     Findings: No rash.  Neurological:     General: No focal deficit present.     Mental Status: She is alert and oriented to person, place, and time.  Psychiatric:        Mood and Affect: Mood normal.        Behavior: Behavior normal.        Thought Content: Thought content normal.      BP 112/74 (BP Location: Left Arm, Patient Position: Sitting, Cuff Size: Large)   Pulse 76   Temp 97.8 F (36.6 C) (Temporal)   Ht 5\' 3"  (1.6 m)   Wt 208 lb (94.3 kg)   SpO2 99%   BMI 36.85 kg/m  Wt Readings from Last 3 Encounters:  02/04/23 208 lb (94.3 kg)  02/04/23 208 lb 4.8 oz (94.5 kg)  12/20/22 214 lb (97.1 kg)       Assessment & Plan:   Problem List Items Addressed This Visit     Obesity (BMI 30-39.9) - Primary   Other Visit Diagnoses       Environmental and seasonal allergies          She is doing well on Zepbound 2.5 mg weekly.  No significant side effects.  She has only taken 4 doses.  She will pick up her refill today.  She will let me know in 4 weeks if she would like to increase the dose or continue on her current dose.  Continue working on healthy diet and exercise in addition to the medication. Discussed switching Zyrtec to Xyzal or Claritin.  Her Zyrtec seems to be effective now for her allergy symptoms.  Discussed using a good moisturizer.  Try over-the-counter hydrocortisone as needed for rash and itching.  I am having Henri Kory maintain her Cetirizine HCl (ZYRTEC ALLERGY PO), ferrous sulfate, NON FORMULARY, tranexamic acid, escitalopram, dicyclomine, pantoprazole, and Zepbound.  No orders of the defined types were placed in this encounter.

## 2023-02-04 NOTE — Patient Instructions (Signed)
Send me a MyChart message in 4 weeks and with me know how you are doing on the medication and if we need to increase the dose at that time please.  Switch your allergy pill to Xyzal or Claritin or Allegra. You can use over-the-counter hydrocortisone as needed but never more than 1 to 2 weeks at a time. Use cool compresses.  Finish your showers and baths with cool water to help calm down the histamine release.

## 2023-02-08 ENCOUNTER — Encounter: Payer: Self-pay | Admitting: Physician Assistant

## 2023-02-25 ENCOUNTER — Ambulatory Visit: Payer: BC Managed Care – PPO | Admitting: Family Medicine

## 2023-03-14 ENCOUNTER — Other Ambulatory Visit: Payer: Self-pay | Admitting: Family Medicine

## 2023-03-14 DIAGNOSIS — E669 Obesity, unspecified: Secondary | ICD-10-CM

## 2023-04-12 DIAGNOSIS — Z113 Encounter for screening for infections with a predominantly sexual mode of transmission: Secondary | ICD-10-CM | POA: Diagnosis not present

## 2023-04-12 DIAGNOSIS — Z01419 Encounter for gynecological examination (general) (routine) without abnormal findings: Secondary | ICD-10-CM | POA: Diagnosis not present

## 2023-04-19 ENCOUNTER — Other Ambulatory Visit: Payer: Self-pay | Admitting: Family Medicine

## 2023-04-19 ENCOUNTER — Encounter: Payer: Self-pay | Admitting: Family Medicine

## 2023-04-19 MED ORDER — ZEPBOUND 5 MG/0.5ML ~~LOC~~ SOAJ: 5.0000 mg | SUBCUTANEOUS | 3 refills | Status: AC

## 2023-05-28 NOTE — Progress Notes (Deleted)
 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     PRIOR THERAPY:  Iron  IV with Venofer  300 Mg IV weekly for 3 weeks. Last dose was given on 05/05/22.   CURRENT THERAPY: Ferrous sulfate 325 mg p.o. on as-needed basis.   INTERVAL HISTORY: Regina Martin 38 y.o. female returns to the clinic today for a follow-up visit.  The patient was last seen in the clinic on 02/04/23.  The patient is followed for history of iron  deficiency anemia as well as thalassemia minor.  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 She will is currently taking iron  supplements over-the-counter 1 tablet p.o. daily around the time of her menstrual cycle. She does get constipation and nausea sometimes with her over the counter ferrous sulfate.  She has not needed an iron  infusion since 05/05/2022.   In the interval since last being seen, she is feeling "fine".  She denies any shortness of breath, lightheadedness, or syncope.  Denies any visible bleeding or bruising including epistaxis, gingival bleeding, hemoptysis, hematemesis, melena, or hematochezia. She does have some bruising on her upper extremity.   Her menstrual cycles have improved with taking Lysteda. She denies craving ice chips at this time. She is here today for evaluation and repeat blood work.   MEDICAL HISTORY: Past Medical History:  Diagnosis Date   Abnormal Pap smear    lsil   Anemia    Collagen vascular disease (HCC)    History of ITP    Migraine     ALLERGIES:  is allergic to contrast media [iodinated contrast media].  MEDICATIONS:  Current Outpatient Medications  Medication Sig Dispense Refill   Cetirizine HCl (ZYRTEC ALLERGY PO) Take by mouth.     dicyclomine  (BENTYL ) 10 MG capsule Take 1 tablet every morning, then  every 6 hours as needed. 90 capsule 8   escitalopram  (LEXAPRO ) 5 MG tablet Take 1 tablet (5 mg total) by mouth daily. 30 tablet 5   ferrous sulfate 325 (65 FE) MG EC tablet Take 325 mg by mouth 3 (three) times daily with meals.     NON FORMULARY Hair skin and nail vitamin 2 a day     pantoprazole  (PROTONIX ) 40 MG tablet TAKE 1 TABLET (40 MG TOTAL) BY MOUTH TWICE A DAY BEFORE MEALS 60 tablet 2   tirzepatide  (ZEPBOUND ) 5 MG/0.5ML Pen Inject 5 mg into the skin once a week. 2 mL 3   tranexamic acid (LYSTEDA) 650 MG TABS tablet Take 1,300 mg by mouth 3 (three) times daily. Take the first 5 days of your menstrual cycle     No current facility-administered medications for this visit.    SURGICAL HISTORY:  Past Surgical History:  Procedure Laterality Date   CHOLECYSTECTOMY     spleenectomy  1992   SPLENECTOMY, PARTIAL      REVIEW OF SYSTEMS:   Review of Systems  Constitutional: Negative for appetite change, chills, fatigue, fever and unexpected weight change.  HENT:   Negative for mouth sores, nosebleeds, sore throat and trouble swallowing.   Eyes: Negative for eye problems and icterus.  Respiratory: Negative for cough, hemoptysis, shortness of breath and wheezing.   Cardiovascular: Negative for chest pain and leg swelling.  Gastrointestinal: Negative for abdominal pain, constipation, diarrhea, nausea and vomiting.  Genitourinary: Negative for bladder incontinence, difficulty urinating, dysuria, frequency and hematuria.   Musculoskeletal: Negative for back pain, gait problem, neck pain and neck stiffness.  Skin: Negative for itching and rash.  Neurological: Negative for dizziness, extremity weakness, gait problem, headaches, light-headedness and seizures.  Hematological: Negative for adenopathy. Does not bruise/bleed easily.  Psychiatric/Behavioral: Negative for confusion, depression and sleep disturbance. The patient is not nervous/anxious.     PHYSICAL EXAMINATION:  There were no vitals  taken for this visit.  ECOG PERFORMANCE STATUS: {CHL ONC ECOG [id number]  Physical Exam  Constitutional: Oriented to person, place, and time and well-developed, well-nourished, and in no distress. No distress.  HENT:  Head: Normocephalic and atraumatic.  Mouth/Throat: Oropharynx is clear and moist. No oropharyngeal exudate.  Eyes: Conjunctivae are normal. Right eye exhibits no discharge. Left eye exhibits no discharge. No scleral icterus.  Neck: Normal range of motion. Neck supple.  Cardiovascular: Normal rate, regular rhythm, normal heart sounds and intact distal pulses.   Pulmonary/Chest: Effort normal and breath sounds normal. No respiratory distress. No wheezes. No rales.  Abdominal: Soft. Bowel sounds are normal. Exhibits no distension and no mass. There is no tenderness.  Musculoskeletal: Normal range of motion. Exhibits no edema.  Lymphadenopathy:    No cervical adenopathy.  Neurological: Alert and oriented to person, place, and time. Exhibits normal muscle tone. Gait normal. Coordination normal.  Skin: Skin is warm and dry. No rash noted. Not diaphoretic. No erythema. No pallor.  Psychiatric: Mood, memory and judgment normal.  Vitals reviewed.  LABORATORY DATA: Lab Results  Component Value Date   WBC 9.6 02/04/2023   HGB 13.2 02/04/2023   HCT 40.1 02/04/2023   MCV 67.7 (L) 02/04/2023   PLT 339 02/04/2023      Chemistry      Component Value Date/Time   NA 140 04/05/2022 1138   K 4.0 04/05/2022 1138   CL 107 04/05/2022 1138   CO2 27 04/05/2022 1138   BUN 12 04/05/2022 1138   CREATININE 0.84 04/05/2022 1138   CREATININE 0.76 11/15/2019 1336      Component Value Date/Time   CALCIUM 9.6 04/05/2022 1138   ALKPHOS 75 04/05/2022 1138   AST 13 10/18/2022 1618   AST 16 04/05/2022 1138   ALT 11 10/18/2022 1618   ALT 12 04/05/2022 1138   BILITOT 0.5 10/18/2022 1618   BILITOT 0.4 04/05/2022 1138       RADIOGRAPHIC STUDIES:  No results found.     No orders  of the defined types were placed in this encounter.    I spent {CHL ONC TIME VISIT - RUEAV:[id number]} counseling the patient face to face. The total time spent in the appointment was {CHL ONC TIME VISIT - WGNFA:[id number]}.  Harish Bram L Emmit Oriley, PA-C 05/28/23

## 2023-06-02 ENCOUNTER — Inpatient Hospital Stay: Admitting: Physician Assistant

## 2023-06-02 ENCOUNTER — Inpatient Hospital Stay: Payer: BC Managed Care – PPO | Attending: Family Medicine

## 2023-06-02 ENCOUNTER — Ambulatory Visit: Payer: BC Managed Care – PPO | Admitting: Internal Medicine

## 2023-06-13 NOTE — Progress Notes (Signed)
 [facility] OFFICE PROGRESS NOTE  Abram Abraham, NP-C [address] 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     PRIOR THERAPY:  Iron  IV with Venofer  300 Mg IV weekly for 3 weeks. Last dose was given on 05/05/22.   CURRENT THERAPY: Ferrous sulfate 325 mg p.o. on as-needed basis.   INTERVAL HISTORY: Regina Martin 38 y.o. female returns to the clinic today for a follow-up visit.  The patient was last seen in the clinic on 02/04/23.  The patient is followed for history of iron  deficiency anemia as well as thalassemia minor.  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 She will is currently taking iron  supplements over-the-counter 1 tablet p.o. 3x per week. She does get constipation and nausea sometimes with her over the counter ferrous sulfate.  She has not needed an iron  infusion since 05/05/2022. She has tried to increase her intake of red meat.   In the interval since last being seen, she is feeling fine.  She denies any shortness of breath, lightheadedness, or syncope.  Denies any visible bleeding or bruising including epistaxis, gingival bleeding, hemoptysis, hematemesis, melena, or hematochezia. Her menstrual cycles have improved with taking Lysteda. She denies craving ice chips at this time. She is here today for evaluation and repeat blood work.  MEDICAL HISTORY: Past Medical History:  Diagnosis Date   Abnormal Pap smear    lsil   Anemia    Collagen vascular disease (HCC)    History of ITP    Migraine     ALLERGIES:  is allergic to contrast media [iodinated contrast media].  MEDICATIONS:  Current Outpatient Medications  Medication Sig Dispense Refill   Cetirizine HCl (ZYRTEC ALLERGY PO) Take by mouth.     dicyclomine  (BENTYL ) 10 MG capsule Take 1 tablet every morning, then every 6 hours as needed. 90 capsule 8    escitalopram  (LEXAPRO ) 5 MG tablet Take 1 tablet (5 mg total) by mouth daily. 30 tablet 5   ferrous sulfate 325 (65 FE) MG EC tablet Take 325 mg by mouth 3 (three) times daily with meals.     NON FORMULARY Hair skin and nail vitamin 2 a day     pantoprazole  (PROTONIX ) 40 MG tablet TAKE 1 TABLET (40 MG TOTAL) BY MOUTH TWICE A DAY BEFORE MEALS 60 tablet 2   tirzepatide  (ZEPBOUND ) 5 MG/0.5ML Pen Inject 5 mg into the skin once a week. 2 mL 3   tranexamic acid (LYSTEDA) 650 MG TABS tablet Take 1,300 mg by mouth 3 (three) times daily. Take the first 5 days of your menstrual cycle     No current facility-administered medications for this visit.    SURGICAL HISTORY:  Past Surgical History:  Procedure Laterality Date   CHOLECYSTECTOMY     spleenectomy  1992   SPLENECTOMY, PARTIAL      REVIEW OF SYSTEMS:   Constitutional: Positive for fatigue. Negative for appetite change, chills, fever and unexpected weight change.  HENT: Negative for mouth sores, nosebleeds, sore throat and trouble swallowing.   Eyes: Negative for eye problems and icterus.  Respiratory: Negative for cough, hemoptysis, shortness of breath and wheezing.   Cardiovascular: Negative for chest pain and leg swelling.  Gastrointestinal: Negative for abdominal pain, constipation, diarrhea, nausea and vomiting.  Genitourinary: Negative for bladder incontinence, difficulty urinating, dysuria, frequency and hematuria.  Musculoskeletal: Negative for back pain, gait problem, neck pain and neck stiffness.  Skin: Negative for itching and rash.  Neurological: Negative for dizziness, extremity weakness, gait problem, headaches, light-headedness and seizures.  Hematological: Negative for adenopathy. Does not bleed easily.   Psychiatric/Behavioral: Negative for confusion, depression and sleep disturbance. The patient is not nervous/anxious.     PHYSICAL EXAMINATION:  There were no vitals taken for this visit.  ECOG PERFORMANCE STATUS:  1  Physical Exam  Constitutional: Oriented to person, place, and time and well-developed, well-nourished, and in no distress.  HENT:  Head: Normocephalic and atraumatic.  Mouth/Throat: Oropharynx is clear and moist. No oropharyngeal exudate.  Eyes: Conjunctivae are normal. Right eye exhibits no discharge. Left eye exhibits no discharge. No scleral icterus.  Neck: Normal range of motion. Neck supple.  Cardiovascular: Normal rate, regular rhythm, normal heart sounds and intact distal pulses.   Pulmonary/Chest: Effort normal and breath sounds normal. No respiratory distress. No wheezes. No rales.  Abdominal: Soft. Bowel sounds are normal. Exhibits no distension and no mass. There is no tenderness.  Musculoskeletal: Normal range of motion. Exhibits no edema.  Lymphadenopathy:    No cervical adenopathy.  Neurological: Alert and oriented to person, place, and time. Exhibits normal muscle tone. Gait normal. Coordination normal.  Skin: Positive for some bruising. Skin is warm and dry. No rash noted. Not diaphoretic. No erythema. No pallor.  Psychiatric: Mood, memory and judgment normal.  Vitals reviewed.  LABORATORY DATA: Lab Results  Component Value Date   WBC 9.6 02/04/2023   HGB 13.2 02/04/2023   HCT 40.1 02/04/2023   MCV 67.7 (L) 02/04/2023   PLT 339 02/04/2023      Chemistry      Component Value Date/Time   NA 140 04/05/2022 1138   K 4.0 04/05/2022 1138   CL 107 04/05/2022 1138   CO2 27 04/05/2022 1138   BUN 12 04/05/2022 1138   CREATININE 0.84 04/05/2022 1138   CREATININE 0.76 11/15/2019 1336      Component Value Date/Time   CALCIUM 9.6 04/05/2022 1138   ALKPHOS 75 04/05/2022 1138   AST 13 10/18/2022 1618   AST 16 04/05/2022 1138   ALT 11 10/18/2022 1618   ALT 12 04/05/2022 1138   BILITOT 0.5 10/18/2022 1618   BILITOT 0.4 04/05/2022 1138       RADIOGRAPHIC STUDIES:  No results found.   ASSESSMENT/PLAN:  This is a very pleasant 38 year old African-American  female with iron  deficiency anemia as well as thalassemia minor.  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   She is previously received IV iron  with Venofer  300 mg weekly x 3, the most recent dose being on 05/05/2022.   She is currently taking iron  supplements with ferrous sulfate 3x per week due to intolerance to more frequent.   Her labs are pending today. I let her know I will call her or send a mychart message with instructions and follow up instructions.   In the meantime, she will continue her iron  supplement 3x per week and try to increase her dietary intake of iron  rich food.    We will see her back for labs and follow-up visit in 4 months.   She will continue to follow with GYN intake Lysteda for her menstrual cycles which she reports have helped.   The patient was advised to call immediately if she has any concerning symptoms in the interval. The patient voices understanding of current disease status and treatment options and  is in agreement with the current care plan. All questions were answered. The patient knows to call the clinic with any problems, questions or concerns. We can certainly see the patient much sooner if necessary   No orders of the defined types were placed in this encounter.    The total time spent in the appointment was 20-29 minutes  Darion Juhasz L Glendon Fiser, PA-C 06/13/23

## 2023-06-17 ENCOUNTER — Encounter: Payer: Self-pay | Admitting: Physician Assistant

## 2023-06-17 ENCOUNTER — Ambulatory Visit: Admitting: Physician Assistant

## 2023-06-17 ENCOUNTER — Other Ambulatory Visit

## 2023-06-17 ENCOUNTER — Inpatient Hospital Stay: Admitting: Physician Assistant

## 2023-06-17 ENCOUNTER — Inpatient Hospital Stay: Attending: Family Medicine

## 2023-06-17 VITALS — BP 125/72 | HR 72 | Temp 97.7°F | Resp 17 | Wt 190.7 lb

## 2023-06-17 DIAGNOSIS — Z862 Personal history of diseases of the blood and blood-forming organs and certain disorders involving the immune mechanism: Secondary | ICD-10-CM | POA: Diagnosis not present

## 2023-06-17 DIAGNOSIS — D500 Iron deficiency anemia secondary to blood loss (chronic): Secondary | ICD-10-CM

## 2023-06-17 DIAGNOSIS — D509 Iron deficiency anemia, unspecified: Secondary | ICD-10-CM | POA: Diagnosis not present

## 2023-06-17 LAB — CBC WITH DIFFERENTIAL (CANCER CENTER ONLY)
Abs Immature Granulocytes: 0.02 10*3/uL (ref 0.00–0.07)
Basophils Absolute: 0.1 10*3/uL (ref 0.0–0.1)
Basophils Relative: 1 %
Eosinophils Absolute: 0.3 10*3/uL (ref 0.0–0.5)
Eosinophils Relative: 4 %
HCT: 36.2 % (ref 36.0–46.0)
Hemoglobin: 12.3 g/dL (ref 12.0–15.0)
Immature Granulocytes: 0 %
Lymphocytes Relative: 26 %
Lymphs Abs: 2.3 10*3/uL (ref 0.7–4.0)
MCH: 22.4 pg — ABNORMAL LOW (ref 26.0–34.0)
MCHC: 34 g/dL (ref 30.0–36.0)
MCV: 65.9 fL — ABNORMAL LOW (ref 80.0–100.0)
Monocytes Absolute: 0.6 10*3/uL (ref 0.1–1.0)
Monocytes Relative: 7 %
Neutro Abs: 5.6 10*3/uL (ref 1.7–7.7)
Neutrophils Relative %: 62 %
Platelet Count: 453 10*3/uL — ABNORMAL HIGH (ref 150–400)
RBC: 5.49 MIL/uL — ABNORMAL HIGH (ref 3.87–5.11)
RDW: 18.3 % — ABNORMAL HIGH (ref 11.5–15.5)
WBC Count: 8.9 10*3/uL (ref 4.0–10.5)
nRBC: 0.3 % — ABNORMAL HIGH (ref 0.0–0.2)

## 2023-06-17 LAB — IRON AND IRON BINDING CAPACITY (CC-WL,HP ONLY)
Iron: 81 ug/dL (ref 28–170)
Saturation Ratios: 25 % (ref 10.4–31.8)
TIBC: 322 ug/dL (ref 250–450)
UIBC: 241 ug/dL (ref 148–442)

## 2023-06-17 LAB — FERRITIN: Ferritin: 19 ng/mL (ref 11–307)

## 2023-07-04 ENCOUNTER — Encounter: Payer: Self-pay | Admitting: Infectious Diseases

## 2023-07-04 ENCOUNTER — Other Ambulatory Visit: Payer: Self-pay

## 2023-07-04 MED ORDER — PREDNISONE 10 MG PO TABS
ORAL_TABLET | ORAL | 0 refills | Status: AC
  Filled 2023-07-04: qty 15, 5d supply, fill #0

## 2023-09-07 ENCOUNTER — Other Ambulatory Visit (HOSPITAL_COMMUNITY): Payer: Self-pay

## 2023-09-13 ENCOUNTER — Other Ambulatory Visit (HOSPITAL_COMMUNITY): Payer: Self-pay

## 2023-09-23 ENCOUNTER — Ambulatory Visit: Admitting: Family Medicine

## 2023-09-23 ENCOUNTER — Ambulatory Visit

## 2023-09-23 ENCOUNTER — Encounter: Payer: Self-pay | Admitting: Family Medicine

## 2023-09-23 VITALS — BP 126/84 | HR 70 | Temp 98.4°F | Ht 63.0 in | Wt 185.0 lb

## 2023-09-23 DIAGNOSIS — M5441 Lumbago with sciatica, right side: Secondary | ICD-10-CM

## 2023-09-23 DIAGNOSIS — M47816 Spondylosis without myelopathy or radiculopathy, lumbar region: Secondary | ICD-10-CM | POA: Diagnosis not present

## 2023-09-23 DIAGNOSIS — M48061 Spinal stenosis, lumbar region without neurogenic claudication: Secondary | ICD-10-CM | POA: Diagnosis not present

## 2023-09-23 MED ORDER — PREDNISONE 20 MG PO TABS: 40.0000 mg | ORAL_TABLET | Freq: Every day | ORAL | 0 refills | Status: DC

## 2023-09-23 NOTE — Progress Notes (Signed)
 Subjective:     Patient ID: Regina Martin, female    DOB: Nov 07, 1985, 38 y.o.   MRN: [id number]  Chief Complaint  Patient presents with   Back Pain    Patient here for lower back pain. Started 2 month ago. Worse when sitting radiates to right hip. Patient is only doing tylenol , Voltaren gel and lidocaine patches only helps a little     Back Pain Pertinent negatives include no abdominal pain, chest pain, dysuria, fever or tingling.    Discussed the use of AI scribe software for clinical note transcription with the patient, who gave verbal consent to proceed.  History of Present Illness Regina Martin is a 38 year old female who presents with low back pain for two months.  Low back pain - Low back pain for two months, localized to the middle of the lower back - Pain radiates into the hip without extension down the leg - Worsens with sitting and bending - Stiffness occurs upon standing after bending - Pain is particularly noticeable when sitting for long periods, such as during her brother's volleyball games - Sleeping is possible lying down, though initially painful to lay on her back - Heating pad used for severe pain at night  Associated symptoms and functional impact - No numbness, tingling, or weakness in the legs - No bowel or bladder dysfunction - No fever, chills, or urinary symptoms  Prior treatments and evaluation - Minimal relief with over-the-counter treatments including volunteering gel, Vodacaine patches, and Tylenol  - No prior medical evaluation or imaging before this visit -cannot take NSAIDs due to hx of stomach ulcer      Health Maintenance Due  Topic Date Due   HIV Screening  Never done   Hepatitis C Screening  Never done   Hepatitis B Vaccines 19-59 Average Risk (1 of 3 - 19+ 3-dose series) Never done   HPV VACCINES (1 - 3-dose SCDM series) Never done   Influenza Vaccine  08/05/2023    Past Medical History:  Diagnosis Date   Abnormal Pap  smear    lsil   Anemia    Collagen vascular disease (HCC)    History of ITP    Migraine     Past Surgical History:  Procedure Laterality Date   CHOLECYSTECTOMY     spleenectomy  1992   SPLENECTOMY, PARTIAL      Family History  Problem Relation Age of Onset   Healthy Mother    Seizures Mother    Healthy Father    Colon cancer Maternal Great-grandmother    Esophageal cancer Neg Hx    Stomach cancer Neg Hx     Social History   Socioeconomic History   Marital status: Married    Spouse name: Not on file   Number of children: 2   Years of education: Not on file   Highest education level: Not on file  Occupational History   Occupation: cma  Tobacco Use   Smoking status: Never   Smokeless tobacco: Never  Vaping Use   Vaping status: Never Used  Substance and Sexual Activity   Alcohol use: Yes    Comment: occ   Drug use: Never   Sexual activity: Yes    Birth control/protection: None, Pill  Other Topics Concern   Not on file  Social History Narrative   ** Merged History Encounter **       Social Drivers of Corporate investment banker Strain: Not on file  Food Insecurity: Not on  file  Transportation Needs: Not on file  Physical Activity: Not on file  Stress: Not on file  Social Connections: Not on file  Intimate Partner Violence: Not on file    Outpatient Medications Prior to Visit  Medication Sig Dispense Refill   Cetirizine HCl (ZYRTEC ALLERGY PO) Take by mouth.     dicyclomine  (BENTYL ) 10 MG capsule Take 1 tablet every morning, then every 6 hours as needed. 90 capsule 8   escitalopram  (LEXAPRO ) 5 MG tablet Take 1 tablet (5 mg total) by mouth daily. 30 tablet 5   ferrous sulfate 325 (65 FE) MG EC tablet Take 325 mg by mouth 3 (three) times daily with meals.     NON FORMULARY Hair skin and nail vitamin 2 a day     pantoprazole  (PROTONIX ) 40 MG tablet TAKE 1 TABLET (40 MG TOTAL) BY MOUTH TWICE A DAY BEFORE MEALS 60 tablet 2   tirzepatide  (ZEPBOUND ) 5  MG/0.5ML Pen Inject 5 mg into the skin once a week. 2 mL 3   tranexamic acid (LYSTEDA) 650 MG TABS tablet Take 1,300 mg by mouth 3 (three) times daily. Take the first 5 days of your menstrual cycle     No facility-administered medications prior to visit.    Allergies  Allergen Reactions   Contrast Media [Iodinated Contrast Media] Hives    Review of Systems  Constitutional:  Negative for chills, fever and malaise/fatigue.  Respiratory:  Negative for shortness of breath.   Cardiovascular:  Negative for chest pain, palpitations and leg swelling.  Gastrointestinal:  Negative for abdominal pain, constipation, diarrhea, nausea and vomiting.  Genitourinary:  Negative for dysuria, flank pain, frequency, hematuria and urgency.  Musculoskeletal:  Positive for back pain. Negative for falls.  Neurological:  Negative for dizziness, tingling and focal weakness.       Objective:    Physical Exam Constitutional:      General: She is not in acute distress.    Appearance: She is not ill-appearing.  Eyes:     Extraocular Movements: Extraocular movements intact.     Conjunctiva/sclera: Conjunctivae normal.  Cardiovascular:     Rate and Rhythm: Normal rate.  Pulmonary:     Effort: Pulmonary effort is normal.  Musculoskeletal:     Cervical back: Normal, normal range of motion and neck supple.     Thoracic back: Normal.     Lumbar back: Tenderness present. No bony tenderness. Positive right straight leg raise test and positive left straight leg raise test.     Right lower leg: No edema.     Left lower leg: No edema.     Comments: No midline TTP. Pain with flexion and right rotation. Bilateral LEs are neurovascularly intact.   Skin:    General: Skin is warm and dry.  Neurological:     General: No focal deficit present.     Mental Status: She is alert and oriented to person, place, and time.     Sensory: No sensory deficit.     Motor: No weakness.     Coordination: Coordination normal.      Gait: Gait normal.  Psychiatric:        Mood and Affect: Mood normal.        Behavior: Behavior normal.        Thought Content: Thought content normal.      BP 126/84 (BP Location: Left Arm, Patient Position: Sitting, Cuff Size: Normal)   Pulse 70   Temp 98.4 F (36.9 C) (Oral)  Ht 5' 3 (1.6 m)   Wt 185 lb (83.9 kg)   LMP 09/14/2023 (Exact Date)   SpO2 99%   BMI 32.77 kg/m  Wt Readings from Last 3 Encounters:  09/23/23 185 lb (83.9 kg)  06/17/23 190 lb 11.2 oz (86.5 kg)  02/04/23 208 lb (94.3 kg)       Assessment & Plan:   Problem List Items Addressed This Visit   None Visit Diagnoses       Acute midline low back pain with right-sided sciatica    -  Primary   Relevant Medications   predniSONE  (DELTASONE ) 20 MG tablet   Other Relevant Orders   DG Lumbar Spine 2-3 Views   Ambulatory referral to Sports Medicine       Assessment and Plan Assessment & Plan Low back pain Low back pain for two months, located in the middle of the lower back, radiating to the hip. No known injury, no previous episodes. Pain worsens with sitting and bending, no radiation down the leg, no numbness, tingling, or weakness. No bowel or bladder issues. Pain is not tender over the spine. No imaging has been done previously. - Order lumbar spine x-ray  - Prescribe a short course of prednisone   - Advise continuation of heat application and topical treatments for symptomatic relief. - Provide a handout on back exercises focusing on core strengthening. - Refer to sports medicine for further evaluation and management if no improvement. -prefers to see sports medicine before PT referral      I am having Gwendelyn Yero start on predniSONE . I am also having her maintain her Cetirizine HCl (ZYRTEC ALLERGY PO), ferrous sulfate, NON FORMULARY, tranexamic acid, escitalopram , dicyclomine , pantoprazole , and Zepbound .  Meds ordered this encounter  Medications   predniSONE  (DELTASONE ) 20 MG tablet     Sig: Take 2 tablets (40 mg total) by mouth daily with breakfast.    Dispense:  10 tablet    Refill:  0    Supervising Provider:   ROLLENE NORRIS A [[id number]]

## 2023-09-23 NOTE — Patient Instructions (Signed)
 Please go downstairs for an x-ray before you leave  Take the oral prednisone  with food and plenty of water.  You can also take Tylenol  as needed.   Use a heating pad or ice pack and topical pain medication or patches.   Try the back exercises.   I referred you to [facility] sports medicine and they will call you to schedule.

## 2023-09-27 ENCOUNTER — Ambulatory Visit: Payer: Self-pay | Admitting: Family Medicine

## 2023-09-30 ENCOUNTER — Encounter: Payer: Self-pay | Admitting: Family Medicine

## 2023-10-03 ENCOUNTER — Telehealth: Payer: Self-pay | Admitting: Physician Assistant

## 2023-10-03 NOTE — Telephone Encounter (Signed)
 Patient appointments were rescheduled over the phone.

## 2023-10-04 ENCOUNTER — Telehealth (INDEPENDENT_AMBULATORY_CARE_PROVIDER_SITE_OTHER): Admitting: Family Medicine

## 2023-10-04 ENCOUNTER — Encounter: Payer: Self-pay | Admitting: Family Medicine

## 2023-10-04 DIAGNOSIS — F411 Generalized anxiety disorder: Secondary | ICD-10-CM

## 2023-10-04 MED ORDER — BUPROPION HCL ER (XL) 150 MG PO TB24: 150.0000 mg | ORAL_TABLET | Freq: Every day | ORAL | 4 refills | Status: DC

## 2023-10-04 NOTE — Progress Notes (Signed)
 MyChart Video Visit    Virtual Visit via Video Note    Patient location: Home. Patient and provider in visit Provider location: Office 2 patient identifiers were used  I discussed the limitations of evaluation and management by telemedicine and the availability of in person appointments. The patient expressed understanding and agreed to proceed.  Visit Date: 10/04/2023  Today's healthcare provider: Boby Mackintosh, NP-C     Subjective:    Patient ID: Regina Martin, female    DOB: 08/07/1985, 38 y.o.   MRN: [id number]  Chief Complaint  Patient presents with   Medication Management    Would like to discuss change in medication from lexapro     HPI  She would like to discuss sexual side effects associated with Lexapro .  She has been on Lexapro  for approximately 1 year and is working well for anxiety but more recently she has noticed decreased libido associated with medication.  States she has stopped taking the medicine for 1 to 2 days at times and her libido improves.      Past Medical History:  Diagnosis Date   Abnormal Pap smear    lsil   Anemia    Collagen vascular disease    History of ITP    Migraine     Past Surgical History:  Procedure Laterality Date   CHOLECYSTECTOMY     spleenectomy  1992   SPLENECTOMY, PARTIAL      Family History  Problem Relation Age of Onset   Healthy Mother    Seizures Mother    Healthy Father    Colon cancer Maternal Great-grandmother    Esophageal cancer Neg Hx    Stomach cancer Neg Hx     Social History   Socioeconomic History   Marital status: Married    Spouse name: Not on file   Number of children: 2   Years of education: Not on file   Highest education level: Not on file  Occupational History   Occupation: cma  Tobacco Use   Smoking status: Never   Smokeless tobacco: Never  Vaping Use   Vaping status: Never Used  Substance and Sexual Activity   Alcohol use: Yes    Comment: occ   Drug use: Never    Sexual activity: Yes    Birth control/protection: None, Pill  Other Topics Concern   Not on file  Social History Narrative   ** Merged History Encounter **       Social Drivers of Corporate investment banker Strain: Not on file  Food Insecurity: Not on file  Transportation Needs: Not on file  Physical Activity: Not on file  Stress: Not on file  Social Connections: Not on file  Intimate Partner Violence: Not on file    Outpatient Medications Prior to Visit  Medication Sig Dispense Refill   Cetirizine HCl (ZYRTEC ALLERGY PO) Take by mouth.     dicyclomine  (BENTYL ) 10 MG capsule Take 1 tablet every morning, then every 6 hours as needed. 90 capsule 8   escitalopram  (LEXAPRO ) 5 MG tablet Take 1 tablet (5 mg total) by mouth daily. 30 tablet 5   ferrous sulfate 325 (65 FE) MG EC tablet Take 325 mg by mouth 3 (three) times daily with meals.     NON FORMULARY Hair skin and nail vitamin 2 a day     pantoprazole  (PROTONIX ) 40 MG tablet TAKE 1 TABLET (40 MG TOTAL) BY MOUTH TWICE A DAY BEFORE MEALS 60 tablet 2   tirzepatide  (ZEPBOUND )  5 MG/0.5ML Pen Inject 5 mg into the skin once a week. 2 mL 3   tranexamic acid (LYSTEDA) 650 MG TABS tablet Take 1,300 mg by mouth 3 (three) times daily. Take the first 5 days of your menstrual cycle     predniSONE  (DELTASONE ) 20 MG tablet Take 2 tablets (40 mg total) by mouth daily with breakfast. 10 tablet 0   No facility-administered medications prior to visit.    Allergies  Allergen Reactions   Contrast Media [Iodinated Contrast Media] Hives    ROS No fever, chills, dizziness, chest pain, palpitations, shortness of breath, abdominal pain, nausea, vomiting.    Objective:    Physical Exam  LMP 09/08/2023 (Exact Date)  Wt Readings from Last 3 Encounters:  09/23/23 185 lb (83.9 kg)  06/17/23 190 lb 11.2 oz (86.5 kg)  02/04/23 208 lb (94.3 kg)   Alert and oriented and in no acute distress.  Respirations unlabored.  Normal mood and thought process     Assessment & Plan:   Problem List Items Addressed This Visit     GAD (generalized anxiety disorder) - Primary   Relevant Medications   buPROPion (WELLBUTRIN XL) 150 MG 24 hr tablet   Start Wellbutrin and continue Lexapro .  Follow-up if symptoms continue but advised they should improve.   I have discontinued Tandy Friel's predniSONE . I am also having her start on buPROPion. Additionally, I am having her maintain her Cetirizine HCl (ZYRTEC ALLERGY PO), ferrous sulfate, NON FORMULARY, tranexamic acid, escitalopram , dicyclomine , pantoprazole , and Zepbound .  Meds ordered this encounter  Medications   buPROPion (WELLBUTRIN XL) 150 MG 24 hr tablet    Sig: Take 1 tablet (150 mg total) by mouth daily.    Dispense:  30 tablet    Refill:  4    Supervising Provider:   ROLLENE NORRIS A [[id number]]    I discussed the assessment and treatment plan with the patient. The patient was provided an opportunity to ask questions and all were answered. The patient agreed with the plan and demonstrated an understanding of the instructions.   The patient was advised to call back or seek an in-person evaluation if the symptoms worsen or if the condition fails to improve as anticipated.    Boby Mackintosh, NP-C [facility] [place] [phone] (phone) [phone] (fax)  [facility]

## 2023-10-06 NOTE — Progress Notes (Unsigned)
 Regina Martin [facility] [address] [place] [address] Phone: [phone]   Assessment and Plan:     1. Chronic bilateral low back pain without sciatica (Primary) -Chronic with exacerbation, initial visit - Most consistent with muscular strain in lumbar region radiating to right hip without radicular symptoms.  Likely aggravated by physical activity and prolonged standing, prolonged sitting at work - Reviewed patient's x-ray in clinic.  My interpretation: No acute fracture or vertebral collapse.  Mildly decreased disc space at L1-L2 - Start HEP for low back and core - Start prednisone  Dosepak - Do not recommend p.o. NSAID use due to past medical history of gastric ulcer  15 additional minutes spent for educating Therapeutic Home Exercise Program.  This included exercises focusing on stretching, strengthening, with focus on eccentric aspects.   Long term goals include an improvement in range of motion, strength, endurance as well as avoiding reinjury. Patient's frequency would include in 1-2 times a day, 3-5 times a week for a duration of 6-12 weeks. Proper technique shown and discussed handout in great detail with ATC.  All questions were discussed and answered.      Pertinent previous records reviewed include lumbar x-ray 09/23/2023, internal medicine note 09/23/2023   Follow Up: 2 to 3 weeks for reevaluation.  Could discuss OMT versus physical therapy versus advanced imaging   Subjective:   I, Regina Martin, am serving as a Neurosurgeon for Doctor Morene Mace  Chief Complaint: low back pain   HPI:   10/07/2023 Patient is a 38 year old female with back pain. Patient states pain when sitting for long periods of time, pain radiates to right hip. Pain for about 2 months. Tylenol , Voltaren gel, and lidocaine patches help for a little or not at all. Hip pain is worse at night. No numbness or tingling. Decreased ROM. Hip pain when lying and  sleeping. She is a side sleeper.     Relevant Historical Information: History of gastric ulcer  Additional pertinent review of systems negative.   Current Outpatient Medications:    methylPREDNISolone (MEDROL DOSEPAK) 4 MG TBPK tablet, Take 6 tablets on day 1.  Take 5 tablets on day 2.  Take 4 tablets on day 3.  Take 3 tablets on day 4.  Take 2 tablets on day 5.  Take 1 tablet on day 6., Disp: 21 tablet, Rfl: 0   buPROPion (WELLBUTRIN XL) 150 MG 24 hr tablet, Take 1 tablet (150 mg total) by mouth daily., Disp: 30 tablet, Rfl: 4   Cetirizine HCl (ZYRTEC ALLERGY PO), Take by mouth., Disp: , Rfl:    dicyclomine  (BENTYL ) 10 MG capsule, Take 1 tablet every morning, then every 6 hours as needed., Disp: 90 capsule, Rfl: 8   escitalopram  (LEXAPRO ) 5 MG tablet, Take 1 tablet (5 mg total) by mouth daily., Disp: 30 tablet, Rfl: 5   ferrous sulfate 325 (65 FE) MG EC tablet, Take 325 mg by mouth 3 (three) times daily with meals., Disp: , Rfl:    NON FORMULARY, Hair skin and nail vitamin 2 a day, Disp: , Rfl:    pantoprazole  (PROTONIX ) 40 MG tablet, TAKE 1 TABLET (40 MG TOTAL) BY MOUTH TWICE A DAY BEFORE MEALS, Disp: 60 tablet, Rfl: 2   tirzepatide  (ZEPBOUND ) 5 MG/0.5ML Pen, Inject 5 mg into the skin once a week., Disp: 2 mL, Rfl: 3   tranexamic acid (LYSTEDA) 650 MG TABS tablet, Take 1,300 mg by mouth 3 (three) times daily. Take  the first 5 days of your menstrual cycle, Disp: , Rfl:    Objective:     Vitals:   10/07/23 1028  BP: 130/76  Pulse: 68  SpO2: 98%  Weight: 183 lb (83 kg)  Height: 5' 3 (1.6 m)      Body mass index is 32.42 kg/m.    Physical Exam:    Gen: Appears well, nad, nontoxic and pleasant Psych: Alert and oriented, appropriate mood and affect Neuro: sensation intact, strength is 5/5 in upper and lower extremities, muscle tone wnl Skin: no susupicious lesions or rashes  Back - Normal skin, Spine with normal alignment and no deformity.   No tenderness to vertebral process  palpation.   Paraspinous muscles are not tender and without spasm NTTP gluteal musculature Straight leg raise negative Trendelenberg positive right Piriformis Test negative Gait normal Low back pain worse on right with lumbar extension and flexion.  No pain with rotation   Electronically signed by:  Odis Mace Martin [facility] 10:49 AM 10/07/23

## 2023-10-07 ENCOUNTER — Ambulatory Visit: Admitting: Sports Medicine

## 2023-10-07 VITALS — BP 130/76 | HR 68 | Ht 63.0 in | Wt 183.0 lb

## 2023-10-07 DIAGNOSIS — G8929 Other chronic pain: Secondary | ICD-10-CM

## 2023-10-07 DIAGNOSIS — M545 Low back pain, unspecified: Secondary | ICD-10-CM | POA: Diagnosis not present

## 2023-10-07 MED ORDER — METHYLPREDNISOLONE 4 MG PO TBPK: ORAL_TABLET | ORAL | 0 refills | Status: AC

## 2023-10-07 NOTE — Patient Instructions (Signed)
 Prednisone  dos pak   Low back HEP   2-3 week follow up

## 2023-10-10 NOTE — Progress Notes (Deleted)
 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   PRIOR THERAPY: Iron  IV with Venofer  300 Mg IV weekly for 3 weeks. Last dose was given on 05/05/22   CURRENT THERAPY: Ferrous sulfate 325 mg p.o. on as-needed basis.   INTERVAL HISTORY: Regina Martin 38 y.o. female returns to the clinic today for a follow-up visit.  The patient was last seen in the clinic on 06/17/23.  The patient is followed for history of iron  deficiency anemia as well as thalassemia minor.  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 She will is currently taking iron  supplements over-the-counter 1 tablet p.o. ***3x per week. She does get constipation and nausea sometimes with her over the counter ferrous sulfate.  She has not needed an iron  infusion since 05/05/2022. She has tried to increase her intake of red meat.   She is seeing sports medicine for low back pain.   In the interval since last being seen, she is feeling fine. She denies any shortness of breath, lightheadedness, or syncope. Denies any visible bleeding or bruising including epistaxis, gingival bleeding, hemoptysis, hematemesis, melena, or hematochezia. Her menstrual cycles have improved with taking Lysteda. She denies craving ice chips at this time. She is here today for evaluation and repeat blood work.   MEDICAL HISTORY: Past Medical History:  Diagnosis Date   Abnormal Pap smear    lsil   Anemia    Collagen vascular disease    History of ITP    Migraine     ALLERGIES:  is allergic to contrast media [iodinated contrast media].  MEDICATIONS:  Current Outpatient Medications  Medication Sig Dispense Refill   buPROPion (WELLBUTRIN XL) 150 MG 24 hr tablet Take 1 tablet (150 mg total) by mouth daily. 30 tablet 4   Cetirizine  HCl (ZYRTEC ALLERGY PO) Take by mouth.     dicyclomine  (BENTYL ) 10 MG capsule Take 1 tablet every morning, then every 6 hours as needed. 90 capsule 8   escitalopram  (LEXAPRO ) 5 MG tablet Take 1 tablet (5 mg total) by mouth daily. 30 tablet 5   ferrous sulfate 325 (65 FE) MG EC tablet Take 325 mg by mouth 3 (three) times daily with meals.     methylPREDNISolone (MEDROL DOSEPAK) 4 MG TBPK tablet Take 6 tablets on day 1.  Take 5 tablets on day 2.  Take 4 tablets on day 3.  Take 3 tablets on day 4.  Take 2 tablets on day 5.  Take 1 tablet on day 6. 21 tablet 0   NON FORMULARY Hair skin and nail vitamin 2 a day     pantoprazole  (PROTONIX ) 40 MG tablet TAKE 1 TABLET (40 MG TOTAL) BY MOUTH TWICE A DAY BEFORE MEALS 60 tablet 2   tirzepatide  (ZEPBOUND ) 5 MG/0.5ML Pen Inject 5 mg into the skin once a week. 2 mL 3   tranexamic acid (LYSTEDA) 650 MG TABS tablet Take 1,300 mg by mouth 3 (three) times daily. Take the first 5 days of your menstrual cycle     No current facility-administered medications for this visit.    SURGICAL HISTORY:  Past Surgical History:  Procedure Laterality Date   CHOLECYSTECTOMY     spleenectomy  1992   SPLENECTOMY, PARTIAL      REVIEW OF SYSTEMS:  Review of Systems  Constitutional: Negative for appetite change, chills, fatigue, fever and unexpected weight change.  HENT:   Negative for mouth sores, nosebleeds, sore throat and trouble swallowing.   Eyes: Negative for eye problems and icterus.  Respiratory: Negative for cough, hemoptysis, shortness of breath and wheezing.   Cardiovascular: Negative for chest pain and leg swelling.  Gastrointestinal: Negative for abdominal pain, constipation, diarrhea, nausea and vomiting.  Genitourinary: Negative for bladder incontinence, difficulty urinating, dysuria, frequency and hematuria.   Musculoskeletal: Negative for back pain, gait problem, neck pain and neck stiffness.  Skin: Negative for itching and rash.  Neurological: Negative  for dizziness, extremity weakness, gait problem, headaches, light-headedness and seizures.  Hematological: Negative for adenopathy. Does not bruise/bleed easily.  Psychiatric/Behavioral: Negative for confusion, depression and sleep disturbance. The patient is not nervous/anxious.     PHYSICAL EXAMINATION:  Last menstrual period 09/08/2023.  ECOG PERFORMANCE STATUS: {CHL ONC ECOG [id number]  Physical Exam  Constitutional: Oriented to person, place, and time and well-developed, well-nourished, and in no distress. No distress.  HENT:  Head: Normocephalic and atraumatic.  Mouth/Throat: Oropharynx is clear and moist. No oropharyngeal exudate.  Eyes: Conjunctivae are normal. Right eye exhibits no discharge. Left eye exhibits no discharge. No scleral icterus.  Neck: Normal range of motion. Neck supple.  Cardiovascular: Normal rate, regular rhythm, normal heart sounds and intact distal pulses.   Pulmonary/Chest: Effort normal and breath sounds normal. No respiratory distress. No wheezes. No rales.  Abdominal: Soft. Bowel sounds are normal. Exhibits no distension and no mass. There is no tenderness.  Musculoskeletal: Normal range of motion. Exhibits no edema.  Lymphadenopathy:    No cervical adenopathy.  Neurological: Alert and oriented to person, place, and time. Exhibits normal muscle tone. Gait normal. Coordination normal.  Skin: Skin is warm and dry. No rash noted. Not diaphoretic. No erythema. No pallor.  Psychiatric: Mood, memory and judgment normal.  Vitals reviewed.  LABORATORY DATA: Lab Results  Component Value Date   WBC 8.9 06/17/2023   HGB 12.3 06/17/2023   HCT 36.2 06/17/2023   MCV 65.9 (L) 06/17/2023   PLT 453 (H) 06/17/2023      Chemistry      Component Value Date/Time   NA 140 04/05/2022 1138   K 4.0 04/05/2022 1138   CL 107 04/05/2022 1138   CO2 27 04/05/2022 1138   BUN 12 04/05/2022 1138   CREATININE 0.84 04/05/2022 1138   CREATININE 0.76 11/15/2019 1336       Component Value Date/Time   CALCIUM 9.6 04/05/2022 1138   ALKPHOS 75 04/05/2022 1138   AST 13 10/18/2022 1618   AST 16 04/05/2022 1138   ALT 11 10/18/2022 1618   ALT 12 04/05/2022 1138   BILITOT 0.5 10/18/2022 1618   BILITOT 0.4 04/05/2022 1138       RADIOGRAPHIC STUDIES:  DG Lumbar Spine 2-3 Views Result Date: 09/27/2023 EXAM: 2 or 3 VIEW(S) XRAY OF THE LUMBAR SPINE 09/23/2023 01:28:38 PM COMPARISON: None available. CLINICAL HISTORY: 2 month hx of midline LBP, radiating into right hip, NKI. Two months low back pain, NKI. FINDINGS: LUMBAR SPINE: BONES: No acute fracture. No aggressive appearing osseous lesion. Alignment is normal. DISCS AND DEGENERATIVE CHANGES: Mild intervertebral disc space narrowing and endplate modeling at L1-2, in keeping with mild degenerative disc disease. SOFT TISSUES: No acute abnormality. IMPRESSION: 1. Mild degenerative disc disease at L1-2. Electronically signed by: Dorethia Molt MD 09/27/2023 08:34 PM EDT RP Workstation: [id number]     ASSESSMENT/PLAN:  This  is a very pleasant 38 year old African-American female with iron  deficiency anemia as well as thalassemia minor.  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   She is previously received IV iron  with Venofer  300 mg weekly x 3, the most recent dose being on 05/05/2022.   She is currently taking iron  supplements with ferrous sulfate 3x per week due to intolerance to more frequent.    Her labs are pending today. I let her know I will call her or send a mychart message with instructions and follow up instructions.    In the meantime, she will continue her iron  supplement 3x per week and try to increase her dietary intake of iron  rich food.    We will see her back for labs and follow-up visit in 4 months.   She will continue to follow with GYN intake Lysteda for her menstrual cycles which she reports have helped.   The patient was advised to call immediately if she has any  concerning symptoms in the interval. The patient voices understanding of current disease status and treatment options and is in agreement with the current care plan. All questions were answered. The patient knows to call the clinic with any problems, questions or concerns. We can certainly see the patient much sooner if necessary    No orders of the defined types were placed in this encounter.    I spent {CHL ONC TIME VISIT - DTPQU:[id number]} counseling the patient face to face. The total time spent in the appointment was {CHL ONC TIME VISIT - DTPQU:[id number]}.  Jayelle Page L Eusevio Schriver, PA-C 10/10/23

## 2023-10-17 ENCOUNTER — Other Ambulatory Visit: Payer: Self-pay

## 2023-10-17 ENCOUNTER — Ambulatory Visit: Admitting: Physician Assistant

## 2023-10-17 ENCOUNTER — Other Ambulatory Visit

## 2023-10-17 DIAGNOSIS — D508 Other iron deficiency anemias: Secondary | ICD-10-CM

## 2023-10-18 ENCOUNTER — Inpatient Hospital Stay

## 2023-10-18 ENCOUNTER — Inpatient Hospital Stay: Admitting: Physician Assistant

## 2023-10-18 ENCOUNTER — Other Ambulatory Visit: Payer: Self-pay | Admitting: Family Medicine

## 2023-10-18 NOTE — Progress Notes (Signed)
 [facility] OFFICE PROGRESS NOTE  Lendia Boby CROME, NP-C 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   PRIOR THERAPY: Iron  IV with Venofer  300 Mg IV weekly for 3 weeks. Last dose was given on 05/05/22   CURRENT THERAPY: Ferrous sulfate 325 mg p.o. on as-needed basis.   INTERVAL HISTORY: Regina Martin 38 y.o. female returns to the clinic today for a follow-up visit.  The patient was last seen in the clinic on 06/17/23.  The patient is followed for history of iron  deficiency anemia as well as thalassemia minor.  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 She will is currently taking iron  supplements over-the-counter 1 tablet p.o. 3x per week. She has not needed an iron  infusion since 05/05/2022.   In the interval since last being seen, she is feeling fine. She denies any shortness of breath, lightheadedness, or syncope. Denies any visible bleeding or bruising including epistaxis, gingival bleeding, hemoptysis, hematemesis, melena, or hematochezia. Her menstrual cycles have improved with taking Lysteda. She denies craving ice chips at this time. She is here today for evaluation and repeat blood work.   MEDICAL HISTORY: Past Medical History:  Diagnosis Date   Abnormal Pap smear    lsil   Anemia    Collagen vascular disease    History of ITP    Migraine     ALLERGIES:  is allergic to contrast media [iodinated contrast media].  MEDICATIONS:  Current Outpatient Medications  Medication Sig Dispense Refill   buPROPion (WELLBUTRIN XL) 150 MG 24 hr tablet Take 1 tablet (150 mg total) by mouth daily. 30 tablet 4   Cetirizine HCl (ZYRTEC ALLERGY PO) Take by mouth.     dicyclomine  (BENTYL ) 10 MG capsule Take 1 tablet every morning, then every 6 hours as needed. 90 capsule 8   escitalopram  (LEXAPRO ) 5 MG tablet TAKE 1  TABLET (5 MG TOTAL) BY MOUTH DAILY. 90 tablet 1   ferrous sulfate 325 (65 FE) MG EC tablet Take 325 mg by mouth 3 (three) times daily with meals.     NON FORMULARY Hair skin and nail vitamin 2 a day     pantoprazole  (PROTONIX ) 40 MG tablet TAKE 1 TABLET (40 MG TOTAL) BY MOUTH TWICE A DAY BEFORE MEALS 60 tablet 2   tirzepatide  (ZEPBOUND ) 5 MG/0.5ML Pen Inject 5 mg into the skin once a week. 2 mL 3   tranexamic acid (LYSTEDA) 650 MG TABS tablet Take 1,300 mg by mouth 3 (three) times daily. Take the first 5 days of your menstrual cycle     eletriptan (RELPAX) 20 MG tablet Take 20 mg by mouth every 2 (two) hours as needed for migraine or headache.     methylPREDNISolone (MEDROL DOSEPAK) 4 MG TBPK tablet Take 6 tablets on day 1.  Take 5 tablets on day 2.  Take 4 tablets on day 3.  Take 3 tablets on day 4.  Take 2 tablets on day 5.  Take 1 tablet on day 6. (Patient not taking: Reported on 10/28/2023) 21 tablet 0   No current facility-administered medications for this visit.    SURGICAL HISTORY:  Past Surgical History:  Procedure Laterality Date   CHOLECYSTECTOMY     spleenectomy  1992   SPLENECTOMY, PARTIAL      REVIEW OF SYSTEMS:   Review of Systems  Constitutional:  Negative for appetite change, chills, fatigue, fever and unexpected weight change.  HENT:   Negative for mouth sores, nosebleeds, sore throat and trouble swallowing.   Eyes: Negative for eye problems and icterus.  Respiratory: Negative for cough, hemoptysis, shortness of breath and wheezing.   Cardiovascular: Negative for chest pain and leg swelling.  Gastrointestinal: Negative for abdominal pain, constipation, diarrhea, nausea and vomiting.  Genitourinary: Negative for bladder incontinence, difficulty urinating, dysuria, frequency and hematuria.   Musculoskeletal: Negative for back pain, gait problem, neck pain and neck stiffness.  Skin: Negative for itching and rash.  Neurological: Negative for dizziness, extremity weakness,  gait problem, headaches, light-headedness and seizures.  Hematological: Negative for adenopathy. Does not bruise/bleed easily.  Psychiatric/Behavioral: Negative for confusion, depression and sleep disturbance. The patient is not nervous/anxious.     PHYSICAL EXAMINATION:  Blood pressure (!) 99/59, pulse 73, temperature (!) 97.5 F (36.4 C), temperature source Temporal, resp. rate 16.  ECOG PERFORMANCE STATUS: 0  Physical Exam  Constitutional: Oriented to person, place, and time and well-developed, well-nourished, and in no distress.  HENT:  Head: Normocephalic and atraumatic.  Mouth/Throat: Oropharynx is clear and moist. No oropharyngeal exudate.  Eyes: Conjunctivae are normal. Right eye exhibits no discharge. Left eye exhibits no discharge. No scleral icterus.  Neck: Normal range of motion. Neck supple.  Cardiovascular: Normal rate, regular rhythm, normal heart sounds and intact distal pulses.   Pulmonary/Chest: Effort normal and breath sounds normal. No respiratory distress. No wheezes. No rales.  Abdominal: Soft. Bowel sounds are normal. Exhibits no distension and no mass. There is no tenderness.  Musculoskeletal: Normal range of motion. Exhibits no edema.  Lymphadenopathy:    No cervical adenopathy.  Neurological: Alert and oriented to person, place, and time. Exhibits normal muscle tone. Gait normal. Coordination normal.  Skin: Skin is warm and dry. No rash noted. Not diaphoretic. No erythema. No pallor.  Psychiatric: Mood, memory and judgment normal.  Vitals reviewed.  LABORATORY DATA: Lab Results  Component Value Date   WBC 10.7 (H) 10/28/2023   HGB 11.9 (L) 10/28/2023   HCT 34.6 (L) 10/28/2023   MCV 65.8 (L) 10/28/2023   PLT 406 (H) 10/28/2023      Chemistry      Component Value Date/Time   NA 140 04/05/2022 1138   K 4.0 04/05/2022 1138   CL 107 04/05/2022 1138   CO2 27 04/05/2022 1138   BUN 12 04/05/2022 1138   CREATININE 0.84 04/05/2022 1138   CREATININE 0.76  11/15/2019 1336      Component Value Date/Time   CALCIUM 9.6 04/05/2022 1138   ALKPHOS 75 04/05/2022 1138   AST 13 10/18/2022 1618   AST 16 04/05/2022 1138   ALT 11 10/18/2022 1618   ALT 12 04/05/2022 1138   BILITOT 0.5 10/18/2022 1618   BILITOT 0.4 04/05/2022 1138       RADIOGRAPHIC STUDIES:  No results found.    ASSESSMENT/PLAN:  This is a very pleasant 38 year old African-American female with iron  deficiency anemia as well as thalassemia minor.  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   She is previously received IV iron  with Venofer  300 mg weekly x 3, the most recent dose being on 05/05/2022.   She is currently taking iron  supplements with ferrous sulfate 3x per week due to intolerance to more frequent.    Her labs are pending today. Her platelet count typically needs to be manually looked at due to thrombocytosis. I let her know I would  send a mychart message with instructions and follow up instructions.    In the meantime, she will continue her iron  supplement 3x per week and try to increase her dietary intake of iron  rich food.    We will see her back for labs and follow-up visit in 6 months.   She will continue to follow with GYN intake Lysteda for her menstrual cycles which she reports have helped.   The patient was advised to call immediately if she has any concerning symptoms in the interval. The patient voices understanding of current disease status and treatment options and is in agreement with the current care plan. All questions were answered. The patient knows to call the clinic with any problems, questions or concerns. We can certainly see the patient much sooner if necessary    Orders Placed This Encounter  Procedures   CBC with Differential ([facility]cer Center Only)    Standing Status:   Future    Expected Date:   04/27/2024    Expiration Date:   10/27/2024   Ferritin    Standing Status:   Future    Expected Date:   04/27/2024     Expiration Date:   10/27/2024   Iron  and Iron  Binding Capacity ([facility] only)    Standing Status:   Future    Expected Date:   04/27/2024    Expiration Date:   10/27/2024     The total time spent in the appointment was 20-29 minutes  Giuseppina Quinones L Marwan Lipe, PA-C 10/28/23

## 2023-10-27 NOTE — Progress Notes (Unsigned)
 [name] [address] [place] [address] Phone: [phone]   Assessment and Plan:     1. Chronic bilateral low back pain without sciatica (Primary) 2. Right hip pain -Chronic with exacerbation, subsequent visit - Overall improvement in low back pain, though continued right hip pain after starting HEP and completing prednisone  course.  Most consistent with musculoskeletal dysfunction primarily in gluteal musculature - Discontinue prednisone  course - Use Tylenol  500 to 1000 mg tablets 2-3 times a day for day-to-day pain relief  -Do not recommend p.o. NSAID use due to past medical history of gastric ulcer - Continue HEP for low back.  Start new HEP for hip, gluteal musculature - Patient elected for initial OMT today.  Tolerated well per note below. - Decision today to treat with OMT was based on Physical Exam  After verbal consent patient was treated with HVLA (high velocity low amplitude), ME (muscle energy), FPR (flex positional release), ST (soft tissue), PC/PD (Pelvic Compression/ Pelvic Decompression) techniques in sacrum, lumbar, and pelvic areas. Patient tolerated the procedure well with improvement in symptoms.  Patient educated on potential side effects of soreness and recommended to rest, hydrate, and use Tylenol  as needed for pain control.   15 additional minutes spent for educating Therapeutic Home Exercise Program.  This included exercises focusing on stretching, strengthening, with focus on eccentric aspects.   Long term goals include an improvement in range of motion, strength, endurance as well as avoiding reinjury. Patient's frequency would include in 1-2 times a day, 3-5 times a week for a duration of 6-12 weeks. Proper technique shown and discussed handout in great detail with ATC.  All questions were discussed and answered.    Pertinent previous records reviewed include none   Follow Up: 4 weeks for  reevaluation.  Could consider repeat OMT if patient found benefit from initial treatment.  Could discuss advanced imaging versus physical therapy versus CSI   Subjective:   I, Nora Sabey, am serving as a Neurosurgeon for Doctor Morene Mace   Chief Complaint: low back pain    HPI:    10/07/2023 Patient is a 38 year old female with back pain. Patient states pain when sitting for long periods of time, pain radiates to right hip. Pain for about 2 months. Tylenol , Voltaren gel, and lidocaine patches help for a little or not at all. Hip pain is worse at night. No numbness or tingling. Decreased ROM. Hip pain when lying and sleeping. She is a side sleeper.    10/28/2023 Patient states back is better. Hip is the same    Relevant Historical Information: History of gastric ulcer  Additional pertinent review of systems negative.   Current Outpatient Medications:    buPROPion (WELLBUTRIN XL) 150 MG 24 hr tablet, Take 1 tablet (150 mg total) by mouth daily., Disp: 30 tablet, Rfl: 4   Cetirizine HCl (ZYRTEC ALLERGY PO), Take by mouth., Disp: , Rfl:    dicyclomine  (BENTYL ) 10 MG capsule, Take 1 tablet every morning, then every 6 hours as needed., Disp: 90 capsule, Rfl: 8   eletriptan (RELPAX) 20 MG tablet, Take 20 mg by mouth every 2 (two) hours as needed for migraine or headache., Disp: , Rfl:    escitalopram  (LEXAPRO ) 5 MG tablet, TAKE 1 TABLET (5 MG TOTAL) BY MOUTH DAILY., Disp: 90 tablet, Rfl: 1   ferrous sulfate 325 (65 FE) MG EC tablet, Take 325 mg by mouth 3 (three) times daily with meals., Disp: ,  Rfl:    methylPREDNISolone (MEDROL DOSEPAK) 4 MG TBPK tablet, Take 6 tablets on day 1.  Take 5 tablets on day 2.  Take 4 tablets on day 3.  Take 3 tablets on day 4.  Take 2 tablets on day 5.  Take 1 tablet on day 6. (Patient not taking: Reported on 10/28/2023), Disp: 21 tablet, Rfl: 0   NON FORMULARY, Hair skin and nail vitamin 2 a day, Disp: , Rfl:    pantoprazole  (PROTONIX ) 40 MG tablet, TAKE 1  TABLET (40 MG TOTAL) BY MOUTH TWICE A DAY BEFORE MEALS, Disp: 60 tablet, Rfl: 2   tirzepatide  (ZEPBOUND ) 5 MG/0.5ML Pen, Inject 5 mg into the skin once a week., Disp: 2 mL, Rfl: 3   tranexamic acid (LYSTEDA) 650 MG TABS tablet, Take 1,300 mg by mouth 3 (three) times daily. Take the first 5 days of your menstrual cycle, Disp: , Rfl:    Objective:     Vitals:   10/28/23 0846  BP: 110/68  Weight: 186 lb (84.4 kg)  Height: 5' 3 (1.6 m)      Body mass index is 32.95 kg/m.    Physical Exam:    General: awake, alert, and oriented no acute distress, nontoxic Skin: no suspicious lesions or rashes Neuro:sensation intact distally with no deficits, normal muscle tone, no atrophy, strength 5/5 in all tested lower ext groups Psych: normal mood and affect, speech clear   Right hip: No deformity, swelling or wasting ROM Flexion 90, ext 30, IR 45, ER 45 NTTP over the hip flexors, greater trochanter, gluteal musculature, si joint, lumbar spine Negative log roll with FROM Negative FABER Positive FADIR for lateral hip pain Negative Piriformis test, though increased gluteal strain on right compared to left Negative trendelenberg Gait normal     ASIS Compression Test: Positive Right Sacrum: Positive sphinx, NTTP bilateral sacral base Lumbar: TTP paraspinal, L 1-3 RRSL Pelvis: Right anterior innominate   Electronically signed by:  Odis Mace D.[facility] 9:02 AM 10/28/23

## 2023-10-28 ENCOUNTER — Inpatient Hospital Stay: Admitting: Physician Assistant

## 2023-10-28 ENCOUNTER — Inpatient Hospital Stay: Attending: Physician Assistant

## 2023-10-28 ENCOUNTER — Other Ambulatory Visit: Payer: Self-pay | Admitting: Physician Assistant

## 2023-10-28 ENCOUNTER — Telehealth: Payer: Self-pay | Admitting: Pharmacy Technician

## 2023-10-28 ENCOUNTER — Ambulatory Visit: Admitting: Sports Medicine

## 2023-10-28 VITALS — BP 99/59 | HR 73 | Temp 97.5°F | Resp 16

## 2023-10-28 VITALS — BP 110/68 | Ht 63.0 in | Wt 186.0 lb

## 2023-10-28 DIAGNOSIS — M9905 Segmental and somatic dysfunction of pelvic region: Secondary | ICD-10-CM | POA: Diagnosis not present

## 2023-10-28 DIAGNOSIS — D508 Other iron deficiency anemias: Secondary | ICD-10-CM

## 2023-10-28 DIAGNOSIS — M545 Low back pain, unspecified: Secondary | ICD-10-CM

## 2023-10-28 DIAGNOSIS — D509 Iron deficiency anemia, unspecified: Secondary | ICD-10-CM | POA: Diagnosis not present

## 2023-10-28 DIAGNOSIS — M9904 Segmental and somatic dysfunction of sacral region: Secondary | ICD-10-CM

## 2023-10-28 DIAGNOSIS — M25551 Pain in right hip: Secondary | ICD-10-CM

## 2023-10-28 DIAGNOSIS — M9903 Segmental and somatic dysfunction of lumbar region: Secondary | ICD-10-CM | POA: Diagnosis not present

## 2023-10-28 DIAGNOSIS — G8929 Other chronic pain: Secondary | ICD-10-CM

## 2023-10-28 LAB — CBC WITH DIFFERENTIAL (CANCER CENTER ONLY)
Abs Immature Granulocytes: 0.02 K/uL (ref 0.00–0.07)
Basophils Absolute: 0.1 K/uL (ref 0.0–0.1)
Basophils Relative: 1 %
Eosinophils Absolute: 0.2 K/uL (ref 0.0–0.5)
Eosinophils Relative: 2 %
HCT: 34.6 % — ABNORMAL LOW (ref 36.0–46.0)
Hemoglobin: 11.9 g/dL — ABNORMAL LOW (ref 12.0–15.0)
Immature Granulocytes: 0 %
Lymphocytes Relative: 29 %
Lymphs Abs: 3.1 K/uL (ref 0.7–4.0)
MCH: 22.6 pg — ABNORMAL LOW (ref 26.0–34.0)
MCHC: 34.4 g/dL (ref 30.0–36.0)
MCV: 65.8 fL — ABNORMAL LOW (ref 80.0–100.0)
Monocytes Absolute: 0.8 K/uL (ref 0.1–1.0)
Monocytes Relative: 8 %
Neutro Abs: 6.5 K/uL (ref 1.7–7.7)
Neutrophils Relative %: 60 %
Platelet Count: 406 K/uL — ABNORMAL HIGH (ref 150–400)
RBC: 5.26 MIL/uL — ABNORMAL HIGH (ref 3.87–5.11)
RDW: 17 % — ABNORMAL HIGH (ref 11.5–15.5)
WBC Count: 10.7 K/uL — ABNORMAL HIGH (ref 4.0–10.5)
nRBC: 0 % (ref 0.0–0.2)

## 2023-10-28 LAB — IRON AND IRON BINDING CAPACITY (CC-WL,HP ONLY)
Iron: 63 ug/dL (ref 28–170)
Saturation Ratios: 17 % (ref 10.4–31.8)
TIBC: 381 ug/dL (ref 250–450)
UIBC: 318 ug/dL (ref 148–442)

## 2023-10-28 LAB — FERRITIN: Ferritin: 12 ng/mL (ref 11–307)

## 2023-10-28 NOTE — Telephone Encounter (Signed)
 Ok, thanks. Patient will be scheduled as soon as possible. Luke

## 2023-10-28 NOTE — Progress Notes (Signed)
 I called the patient to review her iron  labs from today.  Her hemoglobin is slightly below the normal range at 11.9.  Her iron  panel is within the limits.  Her ferritin is on the very low end of normal at 12.  The patient is not able to tolerate iron  supplements more than 3 days a week.  I discussed continuing on her iron  supplements p.o. and close monitoring with a repeat appointment in a few months versus a 1 or 2 dose IV iron  infusion to bolster her iron  stores.  She opted to receive an iron  infusion.  I ordered 200 mg of Venofer  at the [facility] x 1-2 doses.  She is in agreement with the plan.

## 2023-10-28 NOTE — Telephone Encounter (Signed)
 Cassandra.  Venofer  is currently entered for 200mg  x1 dose. Just wanted to clarify you only would like x1 dose?  Auth Submission:  Site of care: Site of care: [facility] Payer: [employer] Medication & CPT/J Code(s) submitted: Venofer  (Iron  Sucrose) J1756 Diagnosis Code: D50.9 Route of submission (phone, fax, portal):  Phone # Fax # Auth type: Buy/Bill PB Units/visits requested:  Reference number:  Approval from: 10/28/23 to 01/04/24

## 2023-10-28 NOTE — Patient Instructions (Signed)
 Hip HEP   Tylenol  [id number] mg 2-3 times a day for pain relief   4 week follow up

## 2023-10-30 ENCOUNTER — Other Ambulatory Visit: Payer: Self-pay | Admitting: Family Medicine

## 2023-10-31 ENCOUNTER — Telehealth: Payer: Self-pay | Admitting: Internal Medicine

## 2023-10-31 NOTE — Telephone Encounter (Signed)
 Scheduled patient for her next appointments. Called and left a voicemail with the appointment details.

## 2023-11-11 ENCOUNTER — Ambulatory Visit

## 2023-11-11 MED ORDER — DIPHENHYDRAMINE HCL 25 MG PO CAPS: 25.0000 mg | ORAL_CAPSULE | Freq: Once | ORAL | Status: DC

## 2023-11-11 MED ORDER — ACETAMINOPHEN 325 MG PO TABS: 650.0000 mg | ORAL_TABLET | Freq: Once | ORAL | Status: DC

## 2023-11-11 MED ORDER — IRON SUCROSE 20 MG/ML IV SOLN: 200.0000 mg | Freq: Once | INTRAVENOUS | Status: DC

## 2023-11-24 NOTE — Progress Notes (Deleted)
    Regina Jackson D.[facility] [address] [place] [address] Phone: [phone]   Assessment and Plan:     ***    Pertinent previous records reviewed include ***   Follow Up: ***     Subjective:   I, Regina Martin, am serving as a neurosurgeon for Doctor Morene Mace   Chief Complaint: low back pain    HPI:    10/07/2023 Patient is a 38 year old female with back pain. Patient states pain when sitting for long periods of time, pain radiates to right hip. Pain for about 2 months. Tylenol , Voltaren gel, and lidocaine patches help for a little or not at all. Hip pain is worse at night. No numbness or tingling. Decreased ROM. Hip pain when lying and sleeping. She is a side sleeper.    10/28/2023 Patient states back is better. Hip is the same   11/25/2023 Patient states   Relevant Historical Information: History of gastric ulcer  Additional pertinent review of systems negative.   Current Outpatient Medications:    buPROPion (WELLBUTRIN XL) 150 MG 24 hr tablet, TAKE 1 TABLET BY MOUTH EVERY DAY, Disp: 90 tablet, Rfl: 1   Cetirizine HCl (ZYRTEC ALLERGY PO), Take by mouth., Disp: , Rfl:    dicyclomine  (BENTYL ) 10 MG capsule, Take 1 tablet every morning, then every 6 hours as needed., Disp: 90 capsule, Rfl: 8   eletriptan (RELPAX) 20 MG tablet, Take 20 mg by mouth every 2 (two) hours as needed for migraine or headache., Disp: , Rfl:    escitalopram  (LEXAPRO ) 5 MG tablet, TAKE 1 TABLET (5 MG TOTAL) BY MOUTH DAILY., Disp: 90 tablet, Rfl: 1   ferrous sulfate 325 (65 FE) MG EC tablet, Take 325 mg by mouth 3 (three) times daily with meals., Disp: , Rfl:    methylPREDNISolone (MEDROL DOSEPAK) 4 MG TBPK tablet, Take 6 tablets on day 1.  Take 5 tablets on day 2.  Take 4 tablets on day 3.  Take 3 tablets on day 4.  Take 2 tablets on day 5.  Take 1 tablet on day 6. (Patient not taking: Reported on 10/28/2023), Disp: 21 tablet, Rfl: 0   NON FORMULARY,  Hair skin and nail vitamin 2 a day, Disp: , Rfl:    pantoprazole  (PROTONIX ) 40 MG tablet, TAKE 1 TABLET (40 MG TOTAL) BY MOUTH TWICE A DAY BEFORE MEALS, Disp: 60 tablet, Rfl: 2   tirzepatide  (ZEPBOUND ) 5 MG/0.5ML Pen, Inject 5 mg into the skin once a week., Disp: 2 mL, Rfl: 3   tranexamic acid (LYSTEDA) 650 MG TABS tablet, Take 1,300 mg by mouth 3 (three) times daily. Take the first 5 days of your menstrual cycle, Disp: , Rfl:    Objective:     There were no vitals filed for this visit.    There is no height or weight on file to calculate BMI.    Physical Exam:    ***   Electronically signed by:  Odis Mace D.[facility] 7:36 AM 11/24/23

## 2023-11-25 ENCOUNTER — Ambulatory Visit: Admitting: Sports Medicine

## 2023-11-25 ENCOUNTER — Ambulatory Visit

## 2023-11-25 VITALS — BP 109/74 | HR 74 | Temp 97.7°F | Resp 14 | Ht 63.0 in | Wt 187.4 lb

## 2023-11-25 DIAGNOSIS — D500 Iron deficiency anemia secondary to blood loss (chronic): Secondary | ICD-10-CM | POA: Diagnosis not present

## 2023-11-25 MED ORDER — IRON SUCROSE 20 MG/ML IV SOLN
200.0000 mg | Freq: Once | INTRAVENOUS | Status: AC
  Administered 2023-11-25: 200 mg via INTRAVENOUS
  Filled 2023-11-25: qty 10

## 2023-11-25 NOTE — Progress Notes (Signed)
 Diagnosis: Iron  Deficiency Anemia  Provider:  Praveen Mannam MD  Procedure: IV Push  IV Type: Peripheral, IV Location: L Antecubital  Venofer  (Iron  Sucrose), Dose: 200 mg  Post Infusion IV Care: Observation period completed and Peripheral IV Discontinued  Discharge: Condition: Good, Destination: Home . AVS Declined  Performed by:  Leita FORBES Miles, LPN

## 2023-12-08 NOTE — Progress Notes (Deleted)
    [name] [address] [place] [address] Phone: [phone]   Assessment and Plan:     ***    Pertinent previous records reviewed include ***   Follow Up: ***     Subjective:   I, Regina Martin, am serving as a neurosurgeon for Doctor Morene Mace   Chief Complaint: low back pain    HPI:    10/07/2023 Patient is a 38 year old female with back pain. Patient states pain when sitting for long periods of time, pain radiates to right hip. Pain for about 2 months. Tylenol , Voltaren gel, and lidocaine patches help for a little or not at all. Hip pain is worse at night. No numbness or tingling. Decreased ROM. Hip pain when lying and sleeping. She is a side sleeper.    10/28/2023 Patient states back is better. Hip is the same   12/09/2023 Patient states   Relevant Historical Information: History of gastric ulcer  Additional pertinent review of systems negative.   Current Outpatient Medications:    buPROPion  (WELLBUTRIN  XL) 150 MG 24 hr tablet, TAKE 1 TABLET BY MOUTH EVERY DAY, Disp: 90 tablet, Rfl: 1   Cetirizine HCl (ZYRTEC ALLERGY PO), Take by mouth., Disp: , Rfl:    dicyclomine  (BENTYL ) 10 MG capsule, Take 1 tablet every morning, then every 6 hours as needed., Disp: 90 capsule, Rfl: 8   eletriptan (RELPAX) 20 MG tablet, Take 20 mg by mouth every 2 (two) hours as needed for migraine or headache., Disp: , Rfl:    escitalopram  (LEXAPRO ) 5 MG tablet, TAKE 1 TABLET (5 MG TOTAL) BY MOUTH DAILY., Disp: 90 tablet, Rfl: 1   ferrous sulfate 325 (65 FE) MG EC tablet, Take 325 mg by mouth 3 (three) times daily with meals., Disp: , Rfl:    methylPREDNISolone  (MEDROL  DOSEPAK) 4 MG TBPK tablet, Take 6 tablets on day 1.  Take 5 tablets on day 2.  Take 4 tablets on day 3.  Take 3 tablets on day 4.  Take 2 tablets on day 5.  Take 1 tablet on day 6. (Patient not taking: Reported on 10/28/2023), Disp: 21 tablet, Rfl: 0   NON FORMULARY,  Hair skin and nail vitamin 2 a day, Disp: , Rfl:    pantoprazole  (PROTONIX ) 40 MG tablet, TAKE 1 TABLET (40 MG TOTAL) BY MOUTH TWICE A DAY BEFORE MEALS, Disp: 60 tablet, Rfl: 2   tirzepatide  (ZEPBOUND ) 5 MG/0.5ML Pen, Inject 5 mg into the skin once a week., Disp: 2 mL, Rfl: 3   tranexamic acid (LYSTEDA) 650 MG TABS tablet, Take 1,300 mg by mouth 3 (three) times daily. Take the first 5 days of your menstrual cycle, Disp: , Rfl:    Objective:     There were no vitals filed for this visit.    There is no height or weight on file to calculate BMI.    Physical Exam:    ***   Electronically signed by:  Odis Mace D[facility] 7:34 AM 12/08/23

## 2023-12-09 ENCOUNTER — Ambulatory Visit: Admitting: Sports Medicine

## 2023-12-12 NOTE — Progress Notes (Unsigned)
 [name] [address] [place] [address] Phone: [phone]   Assessment and Plan:     1. Right hip pain (Primary) -Chronic with exacerbation, subsequent visit - Overall improvement in low back pain, though continued right hip pain after starting HEP and completing prednisone  course.  Most consistent with musculoskeletal dysfunction primarily in Hip - completed  prednisone  course - Use Tylenol  500 to 1000 mg tablets 2-3 times a day for day-to-day pain relief  -Do not recommend p.o. NSAID use due to past medical history of gastric ulcer - Continue HEP for low back.  Start new PT for hip, gluteal musculature  Pertinent previous records reviewed include none   Follow Up: 6-8 weeks for reevaluation. Could consider hip xray and CSI     Subjective:   I, Regina Martin am a scribe for Dr. Leonce.     Chief Complaint: low back pain    HPI:    10/07/2023 Patient is a 38 year old female with back pain. Patient states pain when sitting for long periods of time, pain radiates to right hip. Pain for about 2 months. Tylenol , Voltaren gel, and lidocaine patches help for a little or not at all. Hip pain is worse at night. No numbness or tingling. Decreased ROM. Hip pain when lying and sleeping. She is a side sleeper.    10/28/2023 Patient states back is better. Hip is the same   12/13/2023 Patient states that she is better. The back doesn't hurt as much anymore. It is more of the hip now.    Relevant Historical Information: History of gastric ulcer  Additional pertinent review of systems negative.   Current Outpatient Medications:    buPROPion  (WELLBUTRIN  XL) 150 MG 24 hr tablet, TAKE 1 TABLET BY MOUTH EVERY DAY, Disp: 90 tablet, Rfl: 1   Cetirizine HCl (ZYRTEC ALLERGY PO), Take by mouth., Disp: , Rfl:    dicyclomine  (BENTYL ) 10 MG capsule, Take 1 tablet every morning, then every 6 hours as needed., Disp: 90 capsule, Rfl:  8   eletriptan (RELPAX) 20 MG tablet, Take 20 mg by mouth every 2 (two) hours as needed for migraine or headache., Disp: , Rfl:    escitalopram  (LEXAPRO ) 5 MG tablet, TAKE 1 TABLET (5 MG TOTAL) BY MOUTH DAILY., Disp: 90 tablet, Rfl: 1   ferrous sulfate 325 (65 FE) MG EC tablet, Take 325 mg by mouth 3 (three) times daily with meals., Disp: , Rfl:    methylPREDNISolone  (MEDROL  DOSEPAK) 4 MG TBPK tablet, Take 6 tablets on day 1.  Take 5 tablets on day 2.  Take 4 tablets on day 3.  Take 3 tablets on day 4.  Take 2 tablets on day 5.  Take 1 tablet on day 6. (Patient not taking: Reported on 10/28/2023), Disp: 21 tablet, Rfl: 0   NON FORMULARY, Hair skin and nail vitamin 2 a day, Disp: , Rfl:    pantoprazole  (PROTONIX ) 40 MG tablet, TAKE 1 TABLET (40 MG TOTAL) BY MOUTH TWICE A DAY BEFORE MEALS, Disp: 60 tablet, Rfl: 2   tirzepatide  (ZEPBOUND ) 5 MG/0.5ML Pen, Inject 5 mg into the skin once a week., Disp: 2 mL, Rfl: 3   tranexamic acid (LYSTEDA) 650 MG TABS tablet, Take 1,300 mg by mouth 3 (three) times daily. Take the first 5 days of your menstrual cycle, Disp: , Rfl:    Objective:     Vitals:   12/13/23 1300  BP: 110/60  Pulse: 70  Weight: 188 lb (85.3 kg)  Height: 5' 3 (1.6 m)      Body mass index is 33.3 kg/m.    Physical Exam:    General: awake, alert, and oriented no acute distress, nontoxic Skin: no suspicious lesions or rashes Neuro:sensation intact distally with no deficits, normal muscle tone, no atrophy, strength 5/5 in all tested lower ext groups Psych: normal mood and affect, speech clear   Right hip: No deformity, swelling or wasting ROM Flexion 90, ext 30, IR 45, ER 45 NTTP over the hip flexors, greater trochanter, gluteal musculature, si joint, lumbar spine Negative log roll with FROM + FABER for groin pain Positive FADIR for groin pain Negative Piriformis test, though increased gluteal strain on right compared to left Negative trendelenberg Gait normal      Electronically signed by:  Odis Mace D[facility] 1:58 PM 12/13/23

## 2023-12-13 ENCOUNTER — Ambulatory Visit: Admitting: Sports Medicine

## 2023-12-13 VITALS — BP 110/60 | HR 70 | Ht 63.0 in | Wt 188.0 lb

## 2023-12-13 DIAGNOSIS — M25551 Pain in right hip: Secondary | ICD-10-CM | POA: Diagnosis not present

## 2023-12-13 NOTE — Patient Instructions (Signed)
 Referral to physical therapy on [address] Recommend Tylenol  before bed 500 to 1000 mg Follow up in 6 to 8 weeks.

## 2024-01-13 ENCOUNTER — Ambulatory Visit

## 2024-01-18 ENCOUNTER — Other Ambulatory Visit: Payer: Self-pay | Admitting: Infectious Diseases

## 2024-01-18 ENCOUNTER — Other Ambulatory Visit: Payer: Self-pay

## 2024-01-18 MED ORDER — OSELTAMIVIR PHOSPHATE 75 MG PO CAPS
75.0000 mg | ORAL_CAPSULE | Freq: Every day | ORAL | 0 refills | Status: AC
  Filled 2024-01-18: qty 10, 10d supply, fill #0

## 2024-01-26 NOTE — Progress Notes (Unsigned)
"             ° °   [facility] [address] [place] [address] Phone: [phone]   Assessment and Plan:     ***    Pertinent previous records reviewed include ***   Follow Up: ***     Subjective:   I, Regina Martin, am serving as a neurosurgeon for Regina Martin  Chief Complaint: low back pain    HPI:    10/07/2023 Patient is a 39 year old female with back pain. Patient states pain when sitting for long periods of time, pain radiates to right hip. Pain for about 2 months. Tylenol , Voltaren gel, and lidocaine patches help for a little or not at all. Hip pain is worse at night. No numbness or tingling. Decreased ROM. Hip pain when lying and sleeping. She is a side sleeper.    10/28/2023 Patient states back is better. Hip is the same    12/13/2023 Patient states that she is better. The back doesn't hurt as much anymore. It is more of the hip now.   01/27/2024 Patient states   Relevant Historical Information: History of gastric ulcer  Additional pertinent review of systems negative.  Current Medications[1]   Objective:     There were no vitals filed for this visit.    There is no height or weight on file to calculate BMI.    Physical Exam:    ***   Electronically signed by:  [facility] 7:18 AM 01/26/24    [1]  Current Outpatient Medications:    buPROPion  (WELLBUTRIN  XL) 150 MG 24 hr tablet, TAKE 1 TABLET BY MOUTH EVERY DAY, Disp: 90 tablet, Rfl: 1   Cetirizine HCl (ZYRTEC ALLERGY PO), Take by mouth., Disp: , Rfl:    dicyclomine  (BENTYL ) 10 MG capsule, Take 1 tablet every morning, then every 6 hours as needed., Disp: 90 capsule, Rfl: 8   eletriptan (RELPAX) 20 MG tablet, Take 20 mg by mouth every 2 (two) hours as needed for migraine or headache., Disp: , Rfl:    escitalopram  (LEXAPRO ) 5 MG tablet, TAKE 1 TABLET (5 MG TOTAL) BY MOUTH DAILY., Disp: 90 tablet, Rfl: 1   ferrous  sulfate 325 (65 FE) MG EC tablet, Take 325 mg by mouth 3 (three) times daily with meals., Disp: , Rfl:    methylPREDNISolone  (MEDROL  DOSEPAK) 4 MG TBPK tablet, Take 6 tablets on day 1.  Take 5 tablets on day 2.  Take 4 tablets on day 3.  Take 3 tablets on day 4.  Take 2 tablets on day 5.  Take 1 tablet on day 6. (Patient not taking: Reported on 10/28/2023), Disp: 21 tablet, Rfl: 0   NON FORMULARY, Hair skin and nail vitamin 2 a day, Disp: , Rfl:    oseltamivir  (TAMIFLU ) 75 MG capsule, Take 1 capsule (75 mg total) by mouth daily for 10 days., Disp: 10 capsule, Rfl: 0   pantoprazole  (PROTONIX ) 40 MG tablet, TAKE 1 TABLET (40 MG TOTAL) BY MOUTH TWICE A DAY BEFORE MEALS, Disp: 60 tablet, Rfl: 2   tirzepatide  (ZEPBOUND ) 5 MG/0.5ML Pen, Inject 5 mg into the skin once a week., Disp: 2 mL, Rfl: 3   tranexamic acid (LYSTEDA) 650 MG TABS tablet, Take 1,300 mg by mouth 3 (three) times daily. Take the first 5 days of your menstrual cycle, Disp: , Rfl:   "

## 2024-01-27 ENCOUNTER — Ambulatory Visit: Admitting: Sports Medicine

## 2024-04-30 ENCOUNTER — Inpatient Hospital Stay: Admitting: Internal Medicine

## 2024-04-30 ENCOUNTER — Inpatient Hospital Stay
